# Patient Record
Sex: Female | Born: 1975 | Race: White | Hispanic: No | Marital: Married | Smoking: Former smoker
Health system: Southern US, Community
[De-identification: ages and names within clinical notes are randomized; demographics above are authoritative.]

## PROBLEM LIST (undated history)

## (undated) DIAGNOSIS — I10 Essential (primary) hypertension: Secondary | ICD-10-CM

## (undated) HISTORY — PX: TUBAL LIGATION: SHX77

---

## 2012-06-13 HISTORY — PX: TUBAL LIGATION: SHX77

## 2020-07-30 ENCOUNTER — Other Ambulatory Visit: Payer: Self-pay | Admitting: Surgery

## 2020-07-30 ENCOUNTER — Other Ambulatory Visit (HOSPITAL_COMMUNITY): Payer: Self-pay | Admitting: Surgery

## 2020-07-30 DIAGNOSIS — Z6841 Body Mass Index (BMI) 40.0 and over, adult: Secondary | ICD-10-CM

## 2020-08-18 ENCOUNTER — Ambulatory Visit (HOSPITAL_COMMUNITY)
Admission: RE | Admit: 2020-08-18 | Discharge: 2020-08-18 | Disposition: A | Discharge status: Home / Self Care | Payer: 59 | Source: Ambulatory Visit | Attending: Surgery | Admitting: Surgery

## 2020-08-18 ENCOUNTER — Encounter (HOSPITAL_COMMUNITY): Payer: Self-pay | Admitting: Radiology

## 2020-08-18 ENCOUNTER — Other Ambulatory Visit: Payer: Self-pay

## 2020-08-18 DIAGNOSIS — Z6841 Body Mass Index (BMI) 40.0 and over, adult: Secondary | ICD-10-CM | POA: Diagnosis present

## 2020-08-27 ENCOUNTER — Encounter: Payer: Self-pay | Admitting: Skilled Nursing Facility1

## 2020-08-27 ENCOUNTER — Encounter: Payer: 59 | Attending: Surgery | Admitting: Skilled Nursing Facility1

## 2020-08-27 ENCOUNTER — Other Ambulatory Visit: Payer: Self-pay

## 2020-08-27 DIAGNOSIS — E669 Obesity, unspecified: Secondary | ICD-10-CM | POA: Insufficient documentation

## 2020-08-27 NOTE — Progress Notes (Signed)
Nutrition Assessment for Bariatric Surgery Medical Nutrition Therapy Appt Start Time: 1:56 End Time: 2:48  Patient was seen on 08/27/2020 for Pre-Operative Nutrition Assessment. Letter of approval faxed to [facility] bariatric surgery program coordinator on 08/27/2020  Referral stated Supervised Weight Loss (SWL) visits needed: 12  Planned surgery: RYGB Pt expectation of surgery: to lose weight Pt expectation of dietitian: to help learn how to eat healthier    NUTRITION ASSESSMENT   Anthropometrics  Start weight at [facility]: 279.5 lbs (date: 08/27/2020)  Height: 66 in BMI: 45.11 kg/m2     Clinical  Medical hx: N/A Medications: see list Labs: A1C 5.6 Notable signs/symptoms: some back aches  Any previous deficiencies? No  Micronutrient Nutrition Focused Physical Exam: Hair: No issues observed Eyes: No issues observed Mouth: No issues observed Neck: No issues observed Nails: No issues observed Skin: No issues observed  Lifestyle & Dietary Hx   24-Hr Dietary Recall First Meal: protein shake Snack:  Second Meal 1pm: leftovers or fast food  Snack: fruit Third Meal: skipped Snack:  Beverages: coffee + flavored creamer, diet soda, water   Estimated Energy Needs Calories: 1500   NUTRITION DIAGNOSIS  Overweight/obesity ([place]-3.3) related to past poor dietary habits and physical inactivity as evidenced by patient w/ planned RYGB surgery following dietary guidelines for continued weight loss.    NUTRITION INTERVENTION  Nutrition counseling (C-1) and education (E-2) to facilitate bariatric surgery goals.   Pre-Op Goals Reviewed with the Patient . Track food and beverage intake (pen and paper, MyFitness Pal, Baritastic app, etc.) . Make healthy food choices while monitoring portion sizes . Consume 3 meals per day or try to eat every 3-5 hours . Avoid concentrated sugars and fried foods . Keep sugar & fat in the single digits per serving on food  labels . Practice CHEWING your food (aim for applesauce consistency) . Practice not drinking 15 minutes before, during, and 30 minutes after each meal and snack . Avoid all carbonated beverages (ex: soda, sparkling beverages)  . Limit caffeinated beverages (ex: coffee, tea, energy drinks) . Avoid all sugar-sweetened beverages (ex: regular soda, sports drinks)  . Avoid alcohol  . Aim for 64-100 ounces of FLUID daily (with at least half of fluid intake being plain water)  . Aim for at least 60-80 grams of PROTEIN daily . Look for a liquid protein source that contains ?15 g protein and ?5 g carbohydrate (ex: shakes, drinks, shots) . Make a list of non-food related activities . Physical activity is an important part of a healthy lifestyle so keep it moving! The goal is to reach 150 minutes of exercise per week, including cardiovascular and weight baring activity.  *Goals that are bolded indicate the pt would like to start working towards these  Handouts Provided Include  . Bariatric Surgery handouts (Nutrition Visits, Pre-Op Goals, Protein Shakes, Vitamins & Minerals)  Learning Style & Readiness for Change Teaching method utilized: Visual & Auditory  Demonstrated degree of understanding via: Teach Back  Readiness Level: contemplative  Barriers to learning/adherence to lifestyle change: none identified   RD's Notes for Next Visit . Assess pts adherence to chosen goals     MONITORING & EVALUATION Dietary intake, weekly physical activity, body weight, and pre-op goals reached at next nutrition visit.    Next Steps  Patient is to follow up at [facility] for Pre-Op Class >2 weeks before surgery for further nutrition education.  Pt to return for her next SWL visit

## 2020-09-09 ENCOUNTER — Encounter: Payer: 59 | Admitting: Skilled Nursing Facility1

## 2020-09-09 ENCOUNTER — Other Ambulatory Visit: Payer: Self-pay

## 2020-09-09 DIAGNOSIS — E669 Obesity, unspecified: Secondary | ICD-10-CM

## 2020-09-09 NOTE — Progress Notes (Signed)
Supervised Weight Loss Visit Bariatric Nutrition Education Appt Start Time: 3:15  End Time: 3:30  Planned Surgery: RYGB Pt Expectation of Surgery/ Goals: to be healthier and lose weight to be more active with children  1 out of 12 SWL Appointments   Star given previously: yes (09/09/2020)  NUTRITION ASSESSMENT  Anthropometrics  Start weight at [facility]: 279.5 lbs (date: 08/27/2020)  Current weight: 276.9 lbs (09/09/2020) Height: 66 in BMI: 44.69 kg/m2    Clinical  Medical hx: N/A Medications: see list Labs: A1C 5.6 Notable signs/symptoms: some back aches  Any previous deficiencies? No  Lifestyle & Dietary Hx  Pt states she has been logging her food and has been trying to stay under 1900 kcal/day. Pt states she has ordered protein shakes (Protein 2.0) and is looking forward to trying them. Pt states it's been difficult to cut out protein shakes because convenience. Pt states only eaten out 3x since last visit. Pt states she has been trying to thinking through what she eats and that logging has really helped her. Pt felt that some foods in particular like cheeseburgers have been shocking. Pt states she hasn't been weighing herself. Pt states she is feeling so much better and has much more energy. Pt has made a list of activities she enjoys: gardening, reading, puzzles, walks with dogs, diamond dots. Pt states she wants to work on portion sizes by switching to smaller plates/portions. Pt states she is motivated to lose weight for her children so she can do more physical things with them.  Estimated daily fluid intake: adequate Supplements:  Current average weekly physical activity: 20 minutes of yoga 5x/week.   24-Hr Dietary Recall First Meal: protein shake Snack: carrots, almonds, sunflower seeds Second Meal 1pm: leftovers  Snack: fruit Third Meal: grilled chicken + green beans + mac and cheese or rice Snack:  Beverages: coffee + flavored creamer, diet soda, water  Estimated  Energy Needs Calories: 1500  NUTRITION DIAGNOSIS  Overweight/obesity ([place]-3.3) related to past poor dietary habits and physical inactivity as evidenced by patient w/ planned RYGB surgery following dietary guidelines for continued weight loss.    NUTRITION INTERVENTION  Nutrition counseling (C-1) and education (E-2) to facilitate bariatric surgery goals.   Pre-Op Goals Reviewed with the Patient . Track food and beverage intake (pen and paper, MyFitness Pal, Baritastic app, etc.) . Make healthy food choices while monitoring portion sizes . Make a list of non-food related activities . Physical activity is an important part of a healthy lifestyle so keep it moving! The goal is to reach 150 minutes of exercise per week, including cardiovascular and weight baring activity. . NEW: Practice CHEWING your food (aim for applesauce consistency) . NEW: Aim for smaller portion sizes  *Goals that are bolded indicate the pt would like to start working towards these  Handouts Provided Include  . Bariatric Surgery handouts (Nutrition Visits, Pre-Op Goals, Protein Shakes, Vitamins & Minerals)  Learning Style & Readiness for Change Teaching method utilized: Visual & Auditory  Demonstrated degree of understanding via: Teach Back  Readiness Level: Action Barriers to learning/adherence to lifestyle change: none identified   RD's Notes for Next Visit . Assess pts adherence to chosen goals     MONITORING & EVALUATION Dietary intake, weekly physical activity, body weight, and pre-op goals reached at next nutrition visit.    Next Steps  Patient is to follow up at [facility] for Pre-Op Class >2 weeks before surgery for further nutrition education.  Pt to return for her next SWL  visit in 2 weeks.

## 2020-09-30 ENCOUNTER — Other Ambulatory Visit: Payer: Self-pay

## 2020-09-30 ENCOUNTER — Encounter: Payer: 59 | Attending: Surgery | Admitting: Skilled Nursing Facility1

## 2020-09-30 DIAGNOSIS — E669 Obesity, unspecified: Secondary | ICD-10-CM | POA: Insufficient documentation

## 2020-09-30 NOTE — Progress Notes (Signed)
Supervised Weight Loss Visit Bariatric Nutrition Education  Planned Surgery: RYGB Pt Expectation of Surgery/ Goals: to be healthier and lose weight to be more active with children  2 out of 12 SWL Appointments   Star given previously: yes (09/09/2020)  NUTRITION ASSESSMENT  Anthropometrics  Start weight at [facility]: 279.5 lbs (date: 08/27/2020)  Current weight: 275 Height: 66 in BMI: 44.45 kg/m2    Clinical  Medical hx: N/A Medications: see list Labs: A1C 5.6 Notable signs/symptoms: some back aches  Any previous deficiencies? No  Lifestyle & Dietary Hx  (09/09/2020) Pt states she has been logging her food and has been trying to stay under 1900 kcal/day. Pt states she has ordered protein shakes (Protein 2.0) and is looking forward to trying them. Pt states it's been difficult to cut out protein shakes because convenience. Pt states only eaten out 3x since last visit. Pt states she has been trying to thinking through what she eats and that logging has really helped her. Pt felt that some foods in particular like cheeseburgers have been shocking. Pt states she hasn't been weighing herself. Pt states she is feeling so much better and has much more energy. Pt has made a list of activities she enjoys: gardening, reading, puzzles, walks with dogs, diamond dots. Pt states she wants to work on portion sizes by switching to smaller plates/portions. Pt states she is motivated to lose weight for her children so she can do more physical things with them.  Pt states her 2 goals were to cut portions and chew her foods well stating that first day she was famished by 3pm so she added a timer to ring every 3 hours and eat. Pt state she is no longer eating fast food for snacks eating fruits and nuts. Pt state she has noticed due to her food choices she has had a lot more volume that has carried her throughout the day. Pt states she cut out all coffee.   Estimated daily fluid intake: adequate Supplements:   Current average weekly physical activity: 20 minutes of yoga 5x/week; some elliptical   24-Hr Dietary Recall: wakes 5:30am First Meal: fruit after yoga and then protein shake Snack: carrots, almonds, sunflower seeds Second Meal 1pm: sandwich + veggies or just green beans Snack: fruit and nuts Third Meal: grilled chicken + green beans + mac and cheese or rice or sandwich Snack:  Beverages: 3 diet soda per week, water + flavoring  Estimated Energy Needs Calories: 1500  NUTRITION DIAGNOSIS  Overweight/obesity ([place]-3.3) related to past poor dietary habits and physical inactivity as evidenced by patient w/ planned RYGB surgery following dietary guidelines for continued weight loss.    NUTRITION INTERVENTION  Nutrition counseling (C-1) and education (E-2) to facilitate bariatric surgery goals.   Pre-Op Goals Reviewed with the Patient . Track food and beverage intake (pen and paper, MyFitness Pal, Baritastic app, etc.) . Make healthy food choices while monitoring portion sizes . Make a list of non-food related activities . Physical activity is an important part of a healthy lifestyle so keep it moving! The goal is to reach 150 minutes of exercise per week, including cardiovascular and weight baring activity. . Continue: Practice CHEWING your food (aim for applesauce consistency) . continue: Aim for smaller portion sizes . NEW: increase water to 80-100 ounces; half with flavor half plain   *Goals that are bolded indicate the pt would like to start working towards these  Handouts Provided Include  . Bariatric Surgery handouts (Nutrition Visits, Pre-Op Goals,  Protein Shakes, Vitamins & Minerals)  Learning Style & Readiness for Change Teaching method utilized: Visual & Auditory  Demonstrated degree of understanding via: Teach Back  Readiness Level: Action Barriers to learning/adherence to lifestyle change: none identified   RD's Notes for Next Visit . Assess pts adherence to chosen  goals     MONITORING & EVALUATION Dietary intake, weekly physical activity, body weight, and pre-op goals reached at next nutrition visit.    Next Steps  Patient is to follow up at [facility] for Pre-Op Class >2 weeks before surgery for further nutrition education.  Pt to return for her next SWL visit in 1 week.

## 2020-10-08 ENCOUNTER — Other Ambulatory Visit: Payer: Self-pay

## 2020-10-08 ENCOUNTER — Encounter: Payer: 59 | Admitting: Skilled Nursing Facility1

## 2020-10-08 DIAGNOSIS — E669 Obesity, unspecified: Secondary | ICD-10-CM | POA: Diagnosis not present

## 2020-10-08 NOTE — Progress Notes (Signed)
Supervised Weight Loss Visit Bariatric Nutrition Education  Planned Surgery: RYGB Pt Expectation of Surgery/ Goals: to be healthier and lose weight to be more active with children  3 out of 12 SWL Appointments   Star given previously: yes (09/09/2020)  NUTRITION ASSESSMENT  Anthropometrics  Start weight at [facility]: 279.5 lbs (date: 08/27/2020)  Current weight: 272 Height: 66 in BMI: 44.00 kg/m2    Clinical  Medical hx: N/A Medications: see list Labs: A1C 5.6 Notable signs/symptoms: some back aches  Any previous deficiencies? No  Lifestyle & Dietary Hx  Pt states she logs her food into an app that tracks calories aiming for 1900 calories per day so if she gets to the end of the day and has calories over she will have some goodies.  Pt states she has been drinking 64 ounces per day working on getting in the fifth yetti stating a barrier is not having regular access to the restroom.  Pt states she got rid of her scales.   Estimated daily fluid intake: adequate Supplements:  Current average weekly physical activity: 20 minutes of yoga 5x/week; 2-3 times a week elliptical   24-Hr Dietary Recall: wakes 5:30am First Meal: fruit after yoga and then protein shake Snack : carrots, almonds, sunflower seeds Second Meal 1pm: sandwich + veggies or just green beans or manicotti + green beans Snack: fruit and nuts or roast beef sandwich + carrots + nuts Third Meal: grilled chicken + green beans + mac and cheese or rice or sandwich Snack: 3 Hershey kisses Beverages: 3 diet soda per week, water + flavoring  Estimated Energy Needs Calories: 1500  NUTRITION DIAGNOSIS  Overweight/obesity ([place]-3.3) related to past poor dietary habits and physical inactivity as evidenced by patient w/ planned RYGB surgery following dietary guidelines for continued weight loss.   NUTRITION INTERVENTION  Nutrition counseling (C-1) and education (E-2) to facilitate bariatric surgery goals. Discussion: how to  maintain the change without the motivator being dietitian visits or the upcoming surgery    Pre-Op Goals Reviewed with the Patient . Track food and beverage intake (pen and paper, MyFitness Pal, Baritastic app, etc.) . Make healthy food choices while monitoring portion sizes . Make a list of non-food related activities . Physical activity is an important part of a healthy lifestyle so keep it moving! The goal is to reach 150 minutes of exercise per week, including cardiovascular and weight baring activity. . Continue: Practice CHEWING your food (aim for applesauce consistency) . continue: Aim for smaller portion sizes . Continue: increase water to 80-100 ounces; half with flavor half plain   *Goals that are bolded indicate the pt would like to start working towards these  Handouts Provided Include  . Bariatric Surgery handouts (Nutrition Visits, Pre-Op Goals, Protein Shakes, Vitamins & Minerals)  Learning Style & Readiness for Change Teaching method utilized: Visual & Auditory  Demonstrated degree of understanding via: Teach Back  Readiness Level: Action Barriers to learning/adherence to lifestyle change: none identified   RD's Notes for Next Visit . Assess pts adherence to chosen goals     MONITORING & EVALUATION Dietary intake, weekly physical activity, body weight, and pre-op goals reached at next nutrition visit.    Next Steps  Patient is to follow up at [facility] for Pre-Op Class >2 weeks before surgery for further nutrition education.  Pt to return for her next SWL visit in 1-2 weeks

## 2020-11-05 ENCOUNTER — Encounter: Payer: 59 | Attending: Surgery | Admitting: Skilled Nursing Facility1

## 2020-11-05 ENCOUNTER — Other Ambulatory Visit: Payer: Self-pay

## 2020-11-05 DIAGNOSIS — E669 Obesity, unspecified: Secondary | ICD-10-CM | POA: Insufficient documentation

## 2020-11-05 NOTE — Progress Notes (Signed)
Supervised Weight Loss Visit Bariatric Nutrition Education  Planned Surgery: RYGB Pt Expectation of Surgery/ Goals: to be healthier and lose weight to be more active with children  4 out of 12 SWL Appointments   Star given previously: yes (09/09/2020)  NUTRITION ASSESSMENT  Anthropometrics  Start weight at [facility]: 279.5 lbs (date: 08/27/2020)  Current weight: 271.9 Height: 66 in BMI: 43.89 kg/m2    Clinical  Medical hx: N/A Medications: see list Labs: A1C 5.6 Notable signs/symptoms: some back aches  Any previous deficiencies? No  Lifestyle & Dietary Hx  Pt states she got rid of her scales.   Pt states she noticed her daughter has gotten heal;thy eating habits as well. Pt states she did fall out of doing her yoga but then got back into it due to not feeling well and noticing she feels better when she does yoga.  Pt has been making connections between her lifestyle and her health/how she feels allowing for intrinsic motivation.   Estimated daily fluid intake: adequate Supplements:  Current average weekly physical activity: 20 minutes of yoga 5x/week; 2-3 times a week elliptical   24-Hr Dietary Recall: wakes 5:30am First Meal 7:30-8: fruit after yoga and then protein shake Snack : carrots, almonds, sunflower seeds or cabbage Second Meal 1pm: sandwich + veggies or just green beans or manicotti + green beans or 1 hot pocket + carrots + black cherries Snack: nuts  Third Meal: taco salad: chips, cheese, tomatoes, lettuce, black olives, sour cream Snack: 3 Hershey kisses Beverages: 3 diet soda per week, water + flavoring  Estimated Energy Needs Calories: 1500  NUTRITION DIAGNOSIS  Overweight/obesity ([place]-3.3) related to past poor dietary habits and physical inactivity as evidenced by patient w/ planned RYGB surgery following dietary guidelines for continued weight loss.   NUTRITION INTERVENTION  Nutrition counseling (C-1) and education (E-2) to facilitate bariatric surgery  goals. Discussion: how to maintain the change without the motivator being dietitian visits or the upcoming surgery    Pre-Op Goals Reviewed with the Patient . Track food and beverage intake (pen and paper, MyFitness Pal, Baritastic app, etc.) . Make healthy food choices while monitoring portion sizes . Make a list of non-food related activities . Physical activity is an important part of a healthy lifestyle so keep it moving! The goal is to reach 150 minutes of exercise per week, including cardiovascular and weight baring activity. . Continue: Practice CHEWING your food (aim for applesauce consistency) . continue: Aim for smaller portion sizes . Continue: increase water to 80-100 ounces; half with flavor half plain  . NEW: increase activity intensity; elliptical 3 days a week  Handouts Previously Provided Include  . Bariatric Surgery handouts (Nutrition Visits, Pre-Op Goals, Protein Shakes, Vitamins & Minerals)  Learning Style & Readiness for Change Teaching method utilized: Visual & Auditory  Demonstrated degree of understanding via: Teach Back  Readiness Level: Action Barriers to learning/adherence to lifestyle change: none identified   RD's Notes for Next Visit . Assess pts adherence to chosen goals     MONITORING & EVALUATION Dietary intake, weekly physical activity, body weight, and pre-op goals reached at next nutrition visit.    Next Steps  Patient is to follow up at [facility] for Pre-Op Class >2 weeks before surgery for further nutrition education.  Pt to return for her next SWL visit in 1-2 weeks

## 2020-12-07 ENCOUNTER — Other Ambulatory Visit: Payer: Self-pay

## 2020-12-07 ENCOUNTER — Encounter: Payer: 59 | Attending: Surgery | Admitting: Skilled Nursing Facility1

## 2020-12-07 DIAGNOSIS — E669 Obesity, unspecified: Secondary | ICD-10-CM | POA: Insufficient documentation

## 2020-12-07 NOTE — Progress Notes (Signed)
Supervised Weight Loss Visit Bariatric Nutrition Education  Planned Surgery: RYGB Pt Expectation of Surgery/ Goals: to be healthier and lose weight to be more active with children  5 out of 12 SWL Appointments   Star given previously: yes (09/09/2020)  NUTRITION ASSESSMENT  Anthropometrics  Start weight at [facility]: 279.5 lbs (date: 08/27/2020)  Current weight: 267 Height: 66 in BMI: 43.09 kg/m2    Clinical  Medical hx: N/A Medications: see list Labs: A1C 5.6 Notable signs/symptoms: some back aches  Any previous deficiencies? No  Lifestyle & Dietary Hx  Previous: Pt states she got rid of her scales.   Previous: Pt states she noticed her daughter has gotten heal;thy eating habits as well. Pt states she did fall out of doing her yoga but then got back into it due to not feeling well and noticing she feels better when she does yoga.  Previous: Pt has been making connections between her lifestyle and her health/how she feels allowing for intrinsic motivation.   Pt state she sleeps well about 6-7 hours per night with rest. Pt states she has bowel movements daily without issue. Pt states meal prepping on sundays has been veery helpful throughout the week. Pt states she has been working 60 hours a week due to her partner at work being out. Pt states she was not able ti increase her elliptical but increase her walking.   Estimated daily fluid intake: adequate Supplements:  Current average weekly physical activity: 20 minutes of yoga 5x/week; 2-3 times a week elliptical   24-Hr Dietary Recall: wakes 5:30am First Meal 7:30-8: banana Snack : 1/2 cup oatmeal Second Meal 1pm: baked potato + salad + 4 ounce steak + italian dressing Snack: nuts  Third Meal: cheese ravioli + salad Snack: 3 Hershey kisses Beverages: 3 diet soda per week, water + flavoring, plain water  Estimated Energy Needs Calories: 1500  NUTRITION DIAGNOSIS  Overweight/obesity ([place]-3.3) related to past poor dietary  habits and physical inactivity as evidenced by patient w/ planned RYGB surgery following dietary guidelines for continued weight loss.   NUTRITION INTERVENTION  Nutrition counseling (C-1) and education (E-2) to facilitate bariatric surgery goals. Discussion: how to maintain the change without the motivator being dietitian visits or the upcoming surgery    Pre-Op Goals Reviewed with the Patient Track food and beverage intake (pen and paper, MyFitness Pal, Baritastic app, etc.) Make healthy food choices while monitoring portion sizes Make a list of non-food related activities Physical activity is an important part of a healthy lifestyle so keep it moving! The goal is to reach 150 minutes of exercise per week, including cardiovascular and weight baring activity. Continue: Practice CHEWING your food (aim for applesauce consistency) continue: Aim for smaller portion sizes Continue: increase water to 80-100 ounces; half with flavor half plain  Continue/NEW: increase activity intensity; elliptical 3 days a week  Handouts Previously Provided Include  Bariatric Surgery handouts (Nutrition Visits, Pre-Op Goals, Protein Shakes, Vitamins & Minerals)  Learning Style & Readiness for Change Teaching method utilized: Visual & Auditory  Demonstrated degree of understanding via: Teach Back  Readiness Level: Action Barriers to learning/adherence to lifestyle change: none identified   RD's Notes for Next Visit Assess pts adherence to chosen goals     MONITORING & EVALUATION Dietary intake, weekly physical activity, body weight, and pre-op goals reached at next nutrition visit.    Next Steps  Patient is to follow up at [facility] for Pre-Op Class >2 weeks before surgery for further nutrition education.  Pt to return for her next SWL visit in 1-2 weeks

## 2020-12-17 ENCOUNTER — Other Ambulatory Visit: Payer: Self-pay

## 2020-12-17 ENCOUNTER — Encounter: Payer: 59 | Attending: Surgery | Admitting: Skilled Nursing Facility1

## 2020-12-17 DIAGNOSIS — E669 Obesity, unspecified: Secondary | ICD-10-CM | POA: Insufficient documentation

## 2020-12-17 NOTE — Progress Notes (Signed)
Supervised Weight Loss Visit Bariatric Nutrition Education  Planned Surgery: RYGB Pt Expectation of Surgery/ Goals: to be healthier and lose weight to be more active with children  6 out of 12 SWL Appointments   Star given previously: yes (09/09/2020)  NUTRITION ASSESSMENT  Anthropometrics  Start weight at [facility]: 279.5 lbs (date: 08/27/2020)  Current weight: 266 Height: 66 in BMI: 42.93 kg/m2    Clinical  Medical hx: N/A Medications: see list Labs: A1C 5.6 Notable signs/symptoms: some back aches  Any previous deficiencies? No  Lifestyle & Dietary Hx  Previous: Pt states she got rid of her scales.   Pt states she hates the elliptical so has been going for 2 mile walks when she does not have to go in super early and does the elliptical. Pt states she has had an increased appetite: Dietitian advised not to act on it due to the increase in physical activity.   Estimated daily fluid intake: adequate Supplements:  Current average weekly physical activity: 20 minutes of yoga 5x/week; 2-3 times a week elliptical, walks a couple times a week  24-Hr Dietary Recall: wakes 5:30am First Meal 7:30-8: banana + protein shake Snack : fruit Second Meal 1pm: sandwich + carrots + rice crispy treat Snack: pack of crackers Third Meal: sandwich + cup of broccoli + mac n cheese Snack: 3 Hershey kisses Beverages: 3 diet soda per week, water + flavoring, plain water  Estimated Energy Needs Calories: 1500  NUTRITION DIAGNOSIS  Overweight/obesity ([place]-3.3) related to past poor dietary habits and physical inactivity as evidenced by patient w/ planned RYGB surgery following dietary guidelines for continued weight loss.   NUTRITION INTERVENTION  Nutrition counseling (C-1) and education (E-2) to facilitate bariatric surgery goals. Discussion: how to maintain the change without the motivator being dietitian visits or the upcoming surgery    Pre-Op Goals Reviewed with the Patient Track food and  beverage intake (pen and paper, MyFitness Pal, Baritastic app, etc.) Make healthy food choices while monitoring portion sizes Make a list of non-food related activities Physical activity is an important part of a healthy lifestyle so keep it moving! The goal is to reach 150 minutes of exercise per week, including cardiovascular and weight baring activity. Continue: Practice CHEWING your food (aim for applesauce consistency) continue: Aim for smaller portion sizes Continue: increase water to 80-100 ounces; half with flavor half plain  Continue: increase activity intensity; elliptical 3 days a week NEW: increase intensity of workouts and increase distance to 3 miles for walks  Handouts Previously Provided Include  Bariatric Surgery handouts (Nutrition Visits, Pre-Op Goals, Protein Shakes, Vitamins & Minerals)  Learning Style & Readiness for Change Teaching method utilized: Visual & Auditory  Demonstrated degree of understanding via: Teach Back  Readiness Level: Action Barriers to learning/adherence to lifestyle change: none identified   RD's Notes for Next Visit Assess pts adherence to chosen goals     MONITORING & EVALUATION Dietary intake, weekly physical activity, body weight, and pre-op goals reached at next nutrition visit.    Next Steps  Patient is to follow up at [place] for Pre-Op Class >2 weeks before surgery for further nutrition education.  Pt to return for her next SWL visit in 1-2 weeks

## 2020-12-31 ENCOUNTER — Other Ambulatory Visit: Payer: Self-pay

## 2020-12-31 ENCOUNTER — Encounter: Payer: 59 | Admitting: Skilled Nursing Facility1

## 2020-12-31 DIAGNOSIS — E669 Obesity, unspecified: Secondary | ICD-10-CM

## 2020-12-31 NOTE — Progress Notes (Signed)
Supervised Weight Loss Visit Bariatric Nutrition Education  Planned Surgery: RYGB Pt Expectation of Surgery/ Goals: to be healthier and lose weight to be more active with children  7 out of 12 SWL Appointments   Star given previously: yes (09/09/2020)  NUTRITION ASSESSMENT  Anthropometrics  Start weight at [facility]: 279.5 lbs (date: 08/27/2020)  Current weight: 262 pounds Height: 66 in BMI: 42.42 kg/m2    Clinical  Medical hx: N/A Medications: see list Labs: A1C 5.6 Notable signs/symptoms: some back aches  Any previous deficiencies? No  Lifestyle & Dietary Hx  Previous: Pt states she got rid of her scales.   Pt states she has been doing really with 3 miles in the morning.  Pt states she noticed flavoring in her water caused break outs for her so increasing her water she has had less breakouts.  Pt states she can own her success up to trying eating more often throughout the day and never felt hunger so it felt doable.   Estimated daily fluid intake: adequate Supplements:  Current average weekly physical activity: 20 minutes of yoga 5x/week; 2-3 times a week elliptical, walks a couple times a week  24-Hr Dietary Recall: wakes 5:30am First Meal 7:30-8: banana + protein shake Snack : fruit Second Meal 1pm: sandwich + carrots  Snack: cherries Snack: pretzels + cheese Third Meal: sandwich + cup of broccoli + mac n cheese or steak + baked potato + beans Snack:  Beverages: 3 diet soda per week, water + flavoring,4 17 ounces  plain water  Estimated Energy Needs Calories: 1500  NUTRITION DIAGNOSIS  Overweight/obesity ([place]-3.3) related to past poor dietary habits and physical inactivity as evidenced by patient w/ planned RYGB surgery following dietary guidelines for continued weight loss.   NUTRITION INTERVENTION  Nutrition counseling (C-1) and education (E-2) to facilitate bariatric surgery goals. Discussion: how to maintain the change without the motivator being dietitian  visits or the upcoming surgery    Pre-Op Goals Reviewed with the Patient Track food and beverage intake (pen and paper, MyFitness Pal, Baritastic app, etc.) Make healthy food choices while monitoring portion sizes Make a list of non-food related activities Physical activity is an important part of a healthy lifestyle so keep it moving! The goal is to reach 150 minutes of exercise per week, including cardiovascular and weight baring activity. Continue: Practice CHEWING your food (aim for applesauce consistency) continue: Aim for smaller portion sizes Continue: increase water to 80-100 ounces; half with flavor half plain  Continue: increase activity intensity; elliptical 3 days a week continue: increase intensity of workouts and increase distance to 3 miles for walks NEW: tracking walking verses jogging tracking what her pace is   Handouts Previously Provided Include  Bariatric Surgery handouts (Nutrition Visits, Pre-Op Goals, Protein Shakes, Vitamins & Minerals)  Learning Style & Readiness for Change Teaching method utilized: Visual & Auditory  Demonstrated degree of understanding via: Teach Back  Readiness Level: Action Barriers to learning/adherence to lifestyle change: none identified   RD's Notes for Next Visit Assess pts adherence to chosen goals     MONITORING & EVALUATION Dietary intake, weekly physical activity, body weight, and pre-op goals reached at next nutrition visit.    Next Steps  Patient is to follow up at [place] for Pre-Op Class >2 weeks before surgery for further nutrition education.  Pt to return for her next SWL visit in 1-2 weeks

## 2021-01-13 ENCOUNTER — Encounter: Payer: 59 | Attending: Surgery | Admitting: Skilled Nursing Facility1

## 2021-01-13 ENCOUNTER — Other Ambulatory Visit: Payer: Self-pay

## 2021-01-13 DIAGNOSIS — E669 Obesity, unspecified: Secondary | ICD-10-CM | POA: Diagnosis not present

## 2021-01-13 NOTE — Progress Notes (Signed)
Supervised Weight Loss Visit Bariatric Nutrition Education  Planned Surgery: RYGB Pt Expectation of Surgery/ Goals: to be healthier and lose weight to be more active with children  8 out of 12 SWL Appointments   Star given previously: yes (09/09/2020)  NUTRITION ASSESSMENT  Anthropometrics  Start weight at [facility]: 279.5 lbs (date: 08/27/2020)  Current weight: 263.5 pounds Height: 66 in BMI: 42.53 kg/m2    Clinical  Medical hx: N/A Medications: see list Labs: A1C 5.6 Notable signs/symptoms: some back aches  Any previous deficiencies? No  Lifestyle & Dietary Hx  Previous: Pt states she got rid of her scales.   Pt states she was not able to increase her activity intensity due to her children walking with her recently but is excited to work on her goal of cutting down her time per mile.  Estimated daily fluid intake: adequate Supplements:  Current average weekly physical activity: 20 minutes of yoga 5x/week; 2-3 times a week elliptical, walks a couple times a week  24-Hr Dietary Recall: wakes 5:30am and does yoga First Meal 7:30-8: dry toast + peach preserves  Snack : banana Second Meal 1pm: sandwich + carrots + cherries Snack: greek yogurt Third Meal: sandwich + cup of broccoli + mac n cheese or steak + baked potato + beans or [place] grilled chicken + pineapple +  rice + veggies Snack:  Beverages: plain water  Estimated Energy Needs Calories: 1500  NUTRITION DIAGNOSIS  Overweight/obesity ([place]-3.3) related to past poor dietary habits and physical inactivity as evidenced by patient w/ planned RYGB surgery following dietary guidelines for continued weight loss.   NUTRITION INTERVENTION  Nutrition counseling (C-1) and education (E-2) to facilitate bariatric surgery goals. Discussion: how to maintain the change without the motivator being dietitian visits or the upcoming surgery    Pre-Op Goals Reviewed with the Patient Track food and beverage intake (pen and paper,  MyFitness Pal, Baritastic app, etc.) Make healthy food choices while monitoring portion sizes Make a list of non-food related activities Physical activity is an important part of a healthy lifestyle so keep it moving! The goal is to reach 150 minutes of exercise per week, including cardiovascular and weight baring activity. Continue: Practice CHEWING your food (aim for applesauce consistency) continue: Aim for smaller portion sizes Continue: increase water to 80-100 ounces; half with flavor half plain  Continue: increase activity intensity; elliptical 3 days a week continue: increase intensity of workouts and increase distance to 3 miles for walks Continue/NEW: tracking walking verses jogging tracking what her pace is  NEW: work on staying in the moment with your meals and eating until satisfaction rather than fullness  Handouts Previously Provided Include  Bariatric Surgery handouts (Nutrition Visits, Pre-Op Goals, Protein Shakes, Vitamins & Minerals)  Learning Style & Readiness for Change Teaching method utilized: Visual & Auditory  Demonstrated degree of understanding via: Teach Back  Readiness Level: Action Barriers to learning/adherence to lifestyle change: none identified   RD's Notes for Next Visit Assess pts adherence to chosen goals     MONITORING & EVALUATION Dietary intake, weekly physical activity, body weight, and pre-op goals reached at next nutrition visit.    Next Steps  Patient is to follow up at [place] for Pre-Op Class >2 weeks before surgery for further nutrition education.  Pt to return for her next SWL visit in 1-2 weeks

## 2021-02-01 ENCOUNTER — Encounter: Payer: 59 | Admitting: Skilled Nursing Facility1

## 2021-02-02 ENCOUNTER — Encounter: Payer: 59 | Admitting: Skilled Nursing Facility1

## 2021-02-02 ENCOUNTER — Other Ambulatory Visit: Payer: Self-pay

## 2021-02-02 DIAGNOSIS — E669 Obesity, unspecified: Secondary | ICD-10-CM

## 2021-02-02 NOTE — Progress Notes (Signed)
Supervised Weight Loss Visit Bariatric Nutrition Education  Planned Surgery: RYGB Pt Expectation of Surgery/ Goals: to be healthier and lose weight to be more active with children  9 out of 12 SWL Appointments    NUTRITION ASSESSMENT  Anthropometrics  Start weight at [facility]: 279.5 lbs (date: 08/27/2020)  Current weight: 263.5 pounds Height: 66 in BMI: 42.53 kg/m2    Clinical  Medical hx: N/A Medications: see list Labs: A1C 5.6 Notable signs/symptoms: some back aches  Any previous deficiencies? No  Lifestyle & Dietary Hx  Previous: Pt states she got rid of her scales.   Pt states she just got back from the beach so was worried she would not do well on vacation but states she made informed decisions. Pt states she was still able to go for walks in the morning.  Pt states she was able to increase her pace for her walks.   Estimated daily fluid intake: adequate Supplements:  Current average weekly physical activity: 20 minutes of yoga 5x/week; 2-3 times a week elliptical, walks a couple times a week  24-Hr Dietary Recall: wakes 5:30am and does yoga First Meal 7:30-8: dry toast + peach preserves of 2 packets of oatmeal Snack : banana Second Meal 1pm: sandwich + carrots + cherries or salad  Snack: greek yogurt Third Meal: sandwich + cup of broccoli + mac n cheese or steak + baked potato + beans or [place] grilled chicken + pineapple +  rice + veggies Snack:  Beverages: plain water  Estimated Energy Needs Calories: 1500  NUTRITION DIAGNOSIS  Overweight/obesity ([place]-3.3) related to past poor dietary habits and physical inactivity as evidenced by patient w/ planned RYGB surgery following dietary guidelines for continued weight loss.   NUTRITION INTERVENTION  Nutrition counseling (C-1) and education (E-2) to facilitate bariatric surgery goals. Discussion: how to maintain the change without the motivator being dietitian visits or the upcoming surgery    Pre-Op Goals Reviewed  with the Patient Track food and beverage intake (pen and paper, MyFitness Pal, Baritastic app, etc.) Make healthy food choices while monitoring portion sizes Make a list of non-food related activities Physical activity is an important part of a healthy lifestyle so keep it moving! The goal is to reach 150 minutes of exercise per week, including cardiovascular and weight baring activity. Continue: Practice CHEWING your food (aim for applesauce consistency) continue: Aim for smaller portion sizes Continue: increase water to 80-100 ounces; half with flavor half plain  Continue: increase activity intensity; elliptical 3 days a week continue: increase intensity of workouts and increase distance to 3 miles for walks Continue: tracking walking verses jogging tracking what her pace is  Continue: work on staying in the moment with your meals and eating until satisfaction rather than fullness NEW: avoid drinking with meals   Handouts Previously Provided Include  Bariatric Surgery handouts (Nutrition Visits, Pre-Op Goals, Protein Shakes, Vitamins & Minerals)  Learning Style & Readiness for Change Teaching method utilized: Visual & Auditory  Demonstrated degree of understanding via: Teach Back  Readiness Level: Action Barriers to learning/adherence to lifestyle change: none identified   RD's Notes for Next Visit Assess pts adherence to chosen goals     MONITORING & EVALUATION Dietary intake, weekly physical activity, body weight, and pre-op goals reached at next nutrition visit.    Next Steps  Patient is to follow up at [place] for Pre-Op Class >2 weeks before surgery for further nutrition education.  Pt to return for her next SWL visit in 1-2 weeks

## 2021-03-11 ENCOUNTER — Ambulatory Visit: Payer: Self-pay | Admitting: Surgery

## 2021-03-11 ENCOUNTER — Encounter: Payer: 59 | Attending: Surgery | Admitting: Skilled Nursing Facility1

## 2021-03-11 ENCOUNTER — Other Ambulatory Visit: Payer: Self-pay

## 2021-03-11 DIAGNOSIS — E669 Obesity, unspecified: Secondary | ICD-10-CM

## 2021-03-11 NOTE — Progress Notes (Signed)
Supervised Weight Loss Visit Bariatric Nutrition Education  Planned Surgery: RYGB Pt Expectation of Surgery/ Goals: to be healthier and lose weight to be more active with children  10 out of 12 SWL Appointments    NUTRITION ASSESSMENT  Anthropometrics  Start weight at [facility]: 279.5 lbs (date: 08/27/2020)  Current weight: 264.5 pounds Height: 66 in BMI: 42.69 kg/m2    Clinical  Medical hx: N/A Medications: see list Labs: A1C 5.6, HDL 37.4, vitamin D 28 Notable signs/symptoms: some back aches  Any previous deficiencies? No  Lifestyle & Dietary Hx  Previous: Pt states she got rid of her scales.    Pt sates she recently had covid and feels overall well now but quicker to get winded.   Estimated daily fluid intake: adequate Supplements:  Current average weekly physical activity: 20 minutes of yoga 5x/week; 2-3 times a week elliptical, walks a couple times a week  24-Hr Dietary Recall: wakes 5:30am and does yoga First Meal 7:30-8: dry toast + peach preserves of 1 packets of oatmeal Snack : banana Second Meal 1pm: sandwich + carrots + cherries or salad or roast beef sandwich + green beans Snack: greek yogurt or chicken salad + carrots Third Meal: sandwich + cup of broccoli + mac n cheese or steak + baked potato + beans or [place] grilled chicken + pineapple +  rice + veggies or steak + egg + broccoli Snack:  Beverages: plain water  Estimated Energy Needs Calories: 1500  NUTRITION DIAGNOSIS  Overweight/obesity (-3.3) related to past poor dietary habits and physical inactivity as evidenced by patient w/ planned RYGB surgery following dietary guidelines for continued weight loss.   NUTRITION INTERVENTION  Nutrition counseling (C-1) and education (E-2) to facilitate bariatric surgery goals. Discussion: how to maintain the change without the motivator being dietitian visits or the upcoming surgery    Pre-Op Goals Reviewed with the Patient Track food and beverage intake (pen  and paper, MyFitness Pal, Baritastic app, etc.) Make healthy food choices while monitoring portion sizes Make a list of non-food related activities Physical activity is an important part of a healthy lifestyle so keep it moving! The goal is to reach 150 minutes of exercise per week, including cardiovascular and weight baring activity. Continue: Practice CHEWING your food (aim for applesauce consistency) continue: Aim for smaller portion sizes Continue: increase water to 80-100 ounces; half with flavor half plain  Continue: increase activity intensity; elliptical 3 days a week continue: increase intensity of workouts and increase distance to 3 miles for walks Continue: tracking walking verses jogging tracking what her pace is  Continue: work on staying in the moment with your meals and eating until satisfaction rather than fullness continue: avoid drinking with meals  NEW: build your activity back up  Handouts Previously Provided Include  Bariatric Surgery handouts (Nutrition Visits, Pre-Op Goals, Protein Shakes, Vitamins & Minerals)  Learning Style & Readiness for Change Teaching method utilized: Visual & Auditory  Demonstrated degree of understanding via: Teach Back  Readiness Level: Action Barriers to learning/adherence to lifestyle change: none identified   RD's Notes for Next Visit Assess pts adherence to chosen goals     MONITORING & EVALUATION Dietary intake, weekly physical activity, body weight, and pre-op goals reached at next nutrition visit.    Next Steps  Patient is to follow up at [place] for Pre-Op Class >2 weeks before surgery for further nutrition education.  Pt to return for her next SWL visit in 1-2 weeks

## 2021-03-30 ENCOUNTER — Ambulatory Visit: Payer: 59 | Admitting: Skilled Nursing Facility1

## 2021-04-05 ENCOUNTER — Other Ambulatory Visit: Payer: Self-pay

## 2021-04-05 ENCOUNTER — Encounter: Payer: 59 | Attending: Surgery | Admitting: Skilled Nursing Facility1

## 2021-04-05 DIAGNOSIS — E669 Obesity, unspecified: Secondary | ICD-10-CM | POA: Diagnosis not present

## 2021-04-05 NOTE — Progress Notes (Signed)
Supervised Weight Loss Visit Bariatric Nutrition Education  Planned Surgery: RYGB Pt Expectation of Surgery/ Goals: to be healthier and lose weight to be more active with children  11 out of 12 SWL Appointments    NUTRITION ASSESSMENT  Anthropometrics  Start weight at [facility]: 279.5 lbs (date: 08/27/2020)  Current weight: 266 pounds Height: 66 in BMI: 42.93 kg/m2    Clinical  Medical hx: N/A Medications: see list Labs: A1C 5.6, HDL 37.4, vitamin D 28 Notable signs/symptoms: some back aches  Any previous deficiencies? No  Lifestyle & Dietary Hx  Pt states she has gotten back up to her 3 mile activity.  Pt states she recognizes she overate a touch which resulted in the weight gain.  Pt states she got a rebounder.   Estimated daily fluid intake: adequate Supplements:  Current average weekly physical activity: 20 minutes of yoga 5x/week; 2-3 times a week elliptical, walks a couple times a week  24-Hr Dietary Recall: wakes 5:30am and does yoga First Meal 7:30-8: dry toast + peach preserves of 1 packets of oatmeal Snack : banana Second Meal 1pm: sandwich + carrots + cherries or salad or roast beef sandwich + green beans or chili Snack: greek yogurt or chicken salad + carrots Third Meal: sandwich + cup of broccoli + mac n cheese or steak + baked potato + beans or [place] grilled chicken + pineapple +  rice + veggies or steak + egg + broccoli Snack:  Beverages: plain water  Estimated Energy Needs Calories: 1500  NUTRITION DIAGNOSIS  Overweight/obesity ([place]-3.3) related to past poor dietary habits and physical inactivity as evidenced by patient w/ planned RYGB surgery following dietary guidelines for continued weight loss.   NUTRITION INTERVENTION  Nutrition counseling (C-1) and education (E-2) to facilitate bariatric surgery goals. Discussion: how to maintain the change without the motivator being dietitian visits or the upcoming surgery    Pre-Op Goals Reviewed with the  Patient Track food and beverage intake (pen and paper, MyFitness Pal, Baritastic app, etc.) Make healthy food choices while monitoring portion sizes Make a list of non-food related activities Physical activity is an important part of a healthy lifestyle so keep it moving! The goal is to reach 150 minutes of exercise per week, including cardiovascular and weight baring activity. Continue: Practice CHEWING your food (aim for applesauce consistency) continue: Aim for smaller portion sizes Continue: increase water to 80-100 ounces; half with flavor half plain  Continue: increase activity intensity; elliptical 3 days a week continue: increase intensity of workouts and increase distance to 3 miles for walks Continue: tracking walking verses jogging tracking what her pace is  Continue: work on staying in the moment with your meals and eating until satisfaction rather than fullness continue: avoid drinking with meals  continue: build your activity back up New: portion control  Handouts Previously Provided Include  Bariatric Surgery handouts (Nutrition Visits, Pre-Op Goals, Protein Shakes, Vitamins & Minerals)  Learning Style & Readiness for Change Teaching method utilized: Visual & Auditory  Demonstrated degree of understanding via: Teach Back  Readiness Level: Action Barriers to learning/adherence to lifestyle change: none identified   RD's Notes for Next Visit Assess pts adherence to chosen goals     MONITORING & EVALUATION Dietary intake, weekly physical activity, body weight, and pre-op goals reached at next nutrition visit.    Next Steps  Patient is to follow up at [place] for Pre-Op Class >2 weeks before surgery for further nutrition education.  Pt to return for her next SWL  visit in 1-2 weeks

## 2021-04-14 ENCOUNTER — Encounter: Payer: 59 | Attending: Surgery | Admitting: Skilled Nursing Facility1

## 2021-04-14 ENCOUNTER — Other Ambulatory Visit: Payer: Self-pay

## 2021-04-14 DIAGNOSIS — E669 Obesity, unspecified: Secondary | ICD-10-CM | POA: Diagnosis present

## 2021-04-14 NOTE — Progress Notes (Signed)
Supervised Weight Loss Visit Bariatric Nutrition Education  Planned Surgery: RYGB Pt Expectation of Surgery/ Goals: to be healthier and lose weight to be more active with children  12 out of 12 SWL Appointments   Pt completed visits.   Pt has cleared nutrition requirements.       NUTRITION ASSESSMENT  Anthropometrics  Start weight at [facility]: 279.5 lbs (date: 08/27/2020)  Current weight: 265 pounds Height: 66 in BMI: 42.77 kg/m2    Clinical  Medical hx: N/A Medications: see list Labs: A1C 5.6, HDL 37.4, vitamin D 28 Notable signs/symptoms: some back aches  Any previous deficiencies? No  Lifestyle & Dietary Hx  Pt states in the last 12 visits she has learned how important portion control is, eating often enough, what the portions actually are, increased water amount but not while eating, reduction of diet soda, focusing on the eating rather than the weight specifically.   Pt states he will have to be intentional with portion sizes after surgery.   Estimated daily fluid intake: adequate Supplements:  Current average weekly physical activity: 20 minutes of yoga 5x/week; 2-3 times a week elliptical, walks a couple times a week  24-Hr Dietary Recall: wakes 5:30am and does yoga: continued  First Meal 7:30-8: dry toast + peach preserves of 1 packets of oatmeal Snack : banana Second Meal 1pm: sandwich + carrots + cherries or salad or roast beef sandwich + green beans or chili Snack: greek yogurt or chicken salad + carrots Third Meal: sandwich + cup of broccoli + mac n cheese or steak + baked potato + beans or [place] grilled chicken + pineapple +  rice + veggies or steak + egg + broccoli Snack:  Beverages: plain water  Estimated Energy Needs Calories: 1500  NUTRITION DIAGNOSIS  Overweight/obesity ([place]-3.3) related to past poor dietary habits and physical inactivity as evidenced by patient w/ planned RYGB surgery following dietary guidelines for continued weight loss.    NUTRITION INTERVENTION  Nutrition counseling (C-1) and education (E-2) to facilitate bariatric surgery goals. Discussion: how to maintain the change without the motivator being dietitian visits or the upcoming surgery    Pre-Op Goals Reviewed with the Patient Track food and beverage intake (pen and paper, MyFitness Pal, Baritastic app, etc.) Make healthy food choices while monitoring portion sizes Make a list of non-food related activities Physical activity is an important part of a healthy lifestyle so keep it moving! The goal is to reach 150 minutes of exercise per week, including cardiovascular and weight baring activity. Continue: Practice CHEWING your food (aim for applesauce consistency) continue: Aim for smaller portion sizes Continue: increase water to 80-100 ounces; half with flavor half plain  Continue: increase activity intensity; elliptical 3 days a week continue: increase intensity of workouts and increase distance to 3 miles for walks Continue: tracking walking verses jogging tracking what her pace is  Continue: work on staying in the moment with your meals and eating until satisfaction rather than fullness continue: avoid drinking with meals  continue: build your activity back up continue: portion control  Handouts Previously Provided Include  Bariatric Surgery handouts (Nutrition Visits, Pre-Op Goals, Protein Shakes, Vitamins & Minerals)  Learning Style & Readiness for Change Teaching method utilized: Visual & Auditory  Demonstrated degree of understanding via: Teach Back  Readiness Level: Action Barriers to learning/adherence to lifestyle change: none identified      MONITORING & EVALUATION Dietary intake, weekly physical activity, body weight, and pre-op goals   Next Steps  Patient is to  follow up at [facility] for Pre-Op Class >2 weeks before surgery for further nutrition education.  Pt has completed visits. No further supervised visits required/recomended

## 2021-05-03 ENCOUNTER — Encounter (HOSPITAL_COMMUNITY): Payer: Self-pay | Admitting: Surgery

## 2021-05-17 NOTE — H&P (Signed)
Admitting Physician: Hyman Hopes Tobby Fawcett  Service: Bariatric surgery  CC: Morbid obesity  Subjective   HPI: Charlene Martinez is an 45 y.o. female who is here for EGD prior to bariatric surgery  History reviewed. No pertinent past medical history.  Past Surgical History:  Procedure Laterality Date   CESAREAN SECTION     TUBAL LIGATION      History reviewed. No pertinent family history.  Social:  has no history on file for tobacco use, alcohol use, and drug use.  Allergies:  Allergies  Allergen Reactions   Sulfa Antibiotics Anaphylaxis    Medications: Current Outpatient Medications  Medication Instructions   DENTA 5000 PLUS 1.1 % CREA dental cream 1 application, Oral, Daily at bedtime   levonorgestrel (MIRENA) 20 MCG/DAY IUD 1 Intra Uterine Device, Intrauterine, See admin instructions, Every 5 years   Vitamin D (Ergocalciferol) (DRISDOL) 50,000 Units, Oral, Weekly    ROS - all of the below systems have been reviewed with the patient and positives are indicated with bold text General: chills, fever or night sweats Eyes: blurry vision or double vision ENT: epistaxis or sore throat Allergy/Immunology: itchy/watery eyes or nasal congestion Hematologic/Lymphatic: bleeding problems, blood clots or swollen lymph nodes Endocrine: temperature intolerance or unexpected weight changes Breast: new or changing breast lumps or nipple discharge Resp: cough, shortness of breath, or wheezing CV: chest pain or dyspnea on exertion GI: as per HPI GU: dysuria, trouble voiding, or hematuria MSK: joint pain or joint stiffness Neuro: TIA or stroke symptoms Derm: pruritus and skin lesion changes Psych: anxiety and depression  Objective   PE Blood pressure (!) 158/105, pulse 95, temperature 98.3 F (36.8 C), temperature source Oral, resp. rate (!) 23, height 5\' 6"  (1.676 m), weight 115.7 kg, SpO2 100 %. Constitutional: NAD; conversant; no deformities Eyes: Moist conjunctiva; no  lid lag; anicteric; PERRL Neck: Trachea midline; no thyromegaly Lungs: Normal respiratory effort; no tactile fremitus CV: RRR; no palpable thrills; no pitting edema GI: Abd Soft, nontender; no palpable hepatosplenomegaly MSK: Normal range of motion of extremities; no clubbing/cyanosis Psychiatric: Appropriate affect; alert and oriented x3 Lymphatic: No palpable cervical or axillary lymphadenopathy  No results found for this or any previous visit (from the past 24 hour(s)).  Imaging Orders  No imaging studies ordered today     Assessment and Plan   Charlene Martinez is an 45 y.o. female seen fo bariatric surgery consultation. We had a long discussion about current bariatric surgeries and the surgeries I offer: sleeve gastrectomy and gastric bypass. We discussed the procedures themselves as well as the risks, benefits, and alternatives. I used the [facility] bariatric surgery risk-benefit calculator to facilitate our discussion. After full discussion all questions instrumentation states she would like to pursue a robotic gastric bypass. We will enter her into the preoperative pathway including the following: - Labwork - Dietary consult - Completed with [facility], RD - Chest x-ray - 08/18/20 negative - EKG - NSR 08/18/20 - Evaluation by primary care physician (hasn't seen a doctor in about 7 years) - Does not need a sleep study as she only scored a 2 on her stop bang questionnaire - Psychology evaluation  - Today she presents for upper endoscopy.  I described the procedure of upper endoscopy as well as its risks, benefits, and alternatives and the justification for performing these in my preoperative bariatric patients. I will examine the foraminotomy, biopsy for H. pylori, and evaluate for any evidence of esophagitis or other abnormality of the foregut.  After full discussion all questions answered the patient and consent to proceed.    Felicie Morn, MD  [facility] Use [email or web address] to contact on call provider

## 2021-05-18 ENCOUNTER — Ambulatory Visit (HOSPITAL_COMMUNITY): Payer: 59 | Admitting: Anesthesiology

## 2021-05-18 ENCOUNTER — Encounter (HOSPITAL_COMMUNITY): Payer: Self-pay | Admitting: Surgery

## 2021-05-18 ENCOUNTER — Encounter (HOSPITAL_COMMUNITY)
Admission: RE | Disposition: A | Discharge status: Home / Self Care | Payer: Self-pay | Source: Ambulatory Visit | Attending: Surgery

## 2021-05-18 ENCOUNTER — Other Ambulatory Visit: Payer: Self-pay

## 2021-05-18 ENCOUNTER — Ambulatory Visit (HOSPITAL_COMMUNITY)
Admission: RE | Admit: 2021-05-18 | Discharge: 2021-05-18 | Disposition: A | Discharge status: Home / Self Care | Payer: 59 | Source: Ambulatory Visit | Attending: Surgery | Admitting: Surgery

## 2021-05-18 DIAGNOSIS — Z6841 Body Mass Index (BMI) 40.0 and over, adult: Secondary | ICD-10-CM | POA: Insufficient documentation

## 2021-05-18 DIAGNOSIS — K219 Gastro-esophageal reflux disease without esophagitis: Secondary | ICD-10-CM | POA: Insufficient documentation

## 2021-05-18 HISTORY — PX: BIOPSY: SHX5522

## 2021-05-18 HISTORY — PX: ESOPHAGOGASTRODUODENOSCOPY: SHX5428

## 2021-05-18 SURGERY — EGD (ESOPHAGOGASTRODUODENOSCOPY)
Anesthesia: Monitor Anesthesia Care

## 2021-05-18 MED ORDER — LIDOCAINE HCL (CARDIAC) PF 100 MG/5ML IV SOSY
PREFILLED_SYRINGE | INTRAVENOUS | Status: DC | PRN
  Administered 2021-05-18: 100 mg via INTRAVENOUS

## 2021-05-18 MED ORDER — LIDOCAINE HCL (CARDIAC) PF 100 MG/5ML IV SOSY: PREFILLED_SYRINGE | INTRAVENOUS | Status: DC | PRN

## 2021-05-18 MED ORDER — LACTATED RINGERS IV SOLN
INTRAVENOUS | Status: AC | PRN
  Administered 2021-05-18: 1000 mL via INTRAVENOUS

## 2021-05-18 MED ORDER — ONDANSETRON HCL 4 MG/2ML IJ SOLN
INTRAMUSCULAR | Status: DC | PRN
  Administered 2021-05-18: 4 mg via INTRAVENOUS

## 2021-05-18 MED ORDER — GLYCOPYRROLATE 0.2 MG/ML IJ SOLN
INTRAMUSCULAR | Status: DC | PRN
  Administered 2021-05-18: .1 mg via INTRAVENOUS

## 2021-05-18 MED ORDER — DEXAMETHASONE SODIUM PHOSPHATE 10 MG/ML IJ SOLN
INTRAMUSCULAR | Status: DC | PRN
  Administered 2021-05-18: 8 mg via INTRAVENOUS

## 2021-05-18 MED ORDER — PROPOFOL 1000 MG/100ML IV EMUL
INTRAVENOUS | Status: AC
  Filled 2021-05-18: qty 100

## 2021-05-18 MED ORDER — PROPOFOL 500 MG/50ML IV EMUL
INTRAVENOUS | Status: DC | PRN
  Administered 2021-05-18: 425 ug/kg/min via INTRAVENOUS

## 2021-05-18 NOTE — Discharge Instructions (Signed)
YOU HAD AN ENDOSCOPIC PROCEDURE TODAY: Refer to the procedure report and other information in the discharge instructions given to you for any specific questions about what was found during the examination. If this information does not answer your questions, please call [facility] office at [phone] to clarify.   YOU SHOULD EXPECT: Some feelings of bloating in the abdomen. Passage of more gas than usual. Walking can help get rid of the air that was put into your GI tract during the procedure and reduce the bloating.  DIET: Your first meal following the procedure should be a light meal and then it is ok to progress to your normal diet. A half-sandwich or bowl of soup is an example of a good first meal. Heavy or fried foods are harder to digest and may make you feel nauseous or bloated. Drink plenty of fluids but you should avoid alcoholic beverages for 24 hours. If you had a esophageal dilation, please see attached instructions for diet.    ACTIVITY: Your care partner should take you home directly after the procedure. You should plan to take it easy, moving slowly for the rest of the day. You can resume normal activity the day after the procedure however YOU SHOULD NOT DRIVE, use power tools, machinery or perform tasks that involve climbing or major physical exertion for 24 hours (because of the sedation medicines used during the test).   SYMPTOMS TO REPORT IMMEDIATELY: A general surgeon can be reached at any hour. Please call [phone]  for any of the following symptoms: Following upper endoscopy (EGD, EUS, ERCP, esophageal dilation) Vomiting of blood or coffee ground material  New, significant abdominal pain  New, significant chest pain or pain under the shoulder blades  Painful or persistently difficult swallowing  New shortness of breath  Black, tarry-looking or red, bloody stools  FOLLOW UP:  If any biopsies were taken you will be contacted by phone or by letter within the  next 1-3 weeks. Call [phone]  if you have not heard about the biopsies in 3 weeks.  Please also call with any specific questions about appointments or follow up tests.

## 2021-05-18 NOTE — Anesthesia Postprocedure Evaluation (Signed)
Anesthesia Post Note  Patient: Charlene Martinez  Procedure(s) Performed: ESOPHAGOGASTRODUODENOSCOPY (EGD) BIOPSY     Patient location during evaluation: PACU Anesthesia Type: MAC Level of consciousness: awake and alert Pain management: pain level controlled Vital Signs Assessment: post-procedure vital signs reviewed and stable Respiratory status: spontaneous breathing, nonlabored ventilation, respiratory function stable and patient connected to nasal cannula oxygen Cardiovascular status: stable and blood pressure returned to baseline Postop Assessment: no apparent nausea or vomiting Anesthetic complications: no   No notable events documented.  Last Vitals:  Vitals:   05/18/21 0840 05/18/21 0850  BP: (!) 157/85 (!) 150/80  Pulse: 88 85  Resp: (!) 22 (!) 27  Temp:    SpO2: 98% 97%    Last Pain:  Vitals:   05/18/21 0850  TempSrc:   PainSc: 0-No pain                 Jaiya Mooradian S

## 2021-05-18 NOTE — Op Note (Signed)
[facility] Patient Name: Charlene Martinez Procedure Date: 05/18/2021 MRN: [id number] Attending MD: Felicie Morn ,  Date of Birth: Apr 05, 1976 CSN: [id number] Age: 45 Admit Type: Outpatient Procedure:                Upper GI endoscopy Indications:              Morbid obesity Providers:                Felicie Morn, Ladonna Snide, Technician Referring MD:              Medicines:                Monitored Anesthesia Care Complications:            No immediate complications. Estimated Blood Loss:     Estimated blood loss was minimal. Procedure:                Pre-Anesthesia Assessment:                           - Prior to the procedure, a History and Physical                            was performed, and patient medications and                            allergies were reviewed. The risks and benefits of                            the procedure and the sedation options and risks                            were discussed with the patient. All questions were                            answered and informed consent was obtained. Patient                            identification and proposed procedure were verified                            by the physician in the pre-procedure area. Mental                            Status Examination: alert and oriented. Airway                            Examination: normal oropharyngeal airway and neck                            mobility. Respiratory Examination: clear to                            auscultation. CV Examination: normal.  Prophylactic                            Antibiotics: The patient does not require                            prophylactic antibiotics. Prior Anticoagulants: The                            patient has taken no previous anticoagulant or                            antiplatelet agents. ASA Grade Assessment: II - A                            patient with mild  systemic disease. After reviewing                            the risks and benefits, the patient was deemed in                            satisfactory condition to undergo the procedure.                            The anesthesia plan was to use monitored anesthesia                            care (MAC). Immediately prior to administration of                            medications, the patient was re-assessed for                            adequacy to receive sedatives. The heart rate,                            respiratory rate, oxygen saturations, blood                            pressure, adequacy of pulmonary ventilation, and                            response to care were monitored throughout the                            procedure. The physical status of the patient was                            re-assessed after the procedure.                           After obtaining informed consent, the endoscope was  passed under direct vision. Throughout the                            procedure, the patient's blood pressure, pulse, and                            oxygen saturations were monitored continuously. The                            [id number]-[id number] ([id number] Olympus endoscope was introduced                            through the mouth, and advanced to the second part                            of duodenum. The upper GI endoscopy was                            accomplished without difficulty. The patient                            tolerated the procedure well. Scope In: Scope Out: Findings:      The examined esophagus was normal.      The gastroesophageal flap valve was visualized endoscopically and       classified as Hill Grade I (prominent fold, tight to endoscope).      The entire examined stomach was normal. Biopsies were taken with a cold       forceps for Helicobacter pylori testing. Verification of patient       identification for the specimen was done.  Estimated blood loss was       minimal.      The in the duodenum was normal. Impression:               - Normal esophagus.                           - Gastroesophageal flap valve classified as Hill                            Grade I (prominent fold, tight to endoscope).                           - Normal stomach. Biopsied.                           - Normal.                           - Appears to be a good candidate for gastric bypass. Moderate Sedation:      Moderate (conscious) sedation was personally administered by an       anesthesia professional. The following parameters were monitored: oxygen       saturation, heart rate, blood pressure, and response to care. Total       physician intraservice time was 15 minutes. Recommendation:           - Discharge patient to  home.                           - Resume previous diet. Procedure Code(s):        --- Professional ---                           [phone], Esophagogastroduodenoscopy, flexible,                            transoral; with biopsy, single or multiple Diagnosis Code(s):        --- Professional ---                           E66.01, Morbid (severe) obesity due to excess                            calories CPT copyright 2019 American Medical Association. All rights reserved. The codes documented in this report are preliminary and upon coder review may  be revised to meet current compliance requirements. [place],  05/18/2021 8:27:18 AM This report has been signed electronically. Number of Addenda: 0

## 2021-05-18 NOTE — Anesthesia Preprocedure Evaluation (Signed)
Anesthesia Evaluation  Patient identified by MRN, date of birth, ID band Patient awake    Reviewed: Allergy & Precautions, H&P , NPO status , Patient's Chart, lab work & pertinent test results  Airway Mallampati: II  TM Distance: >3 FB Neck ROM: Full    Dental no notable dental hx.    Pulmonary neg pulmonary ROS,    Pulmonary exam normal breath sounds clear to auscultation       Cardiovascular negative cardio ROS Normal cardiovascular exam Rhythm:Regular Rate:Normal     Neuro/Psych negative neurological ROS  negative psych ROS   GI/Hepatic Neg liver ROS, GERD  ,  Endo/Other  Morbid obesity  Renal/GU negative Renal ROS  negative genitourinary   Musculoskeletal negative musculoskeletal ROS (+)   Abdominal   Peds negative pediatric ROS (+)  Hematology negative hematology ROS (+)   Anesthesia Other Findings   Reproductive/Obstetrics negative OB ROS                             Anesthesia Physical Anesthesia Plan  ASA: 2  Anesthesia Plan: MAC   Post-op Pain Management: Minimal or no pain anticipated   Induction: Intravenous  PONV Risk Score and Plan: 2 and Propofol infusion  Airway Management Planned: Simple Face Mask  Additional Equipment:   Intra-op Plan:   Post-operative Plan:   Informed Consent: I have reviewed the patients History and Physical, chart, labs and discussed the procedure including the risks, benefits and alternatives for the proposed anesthesia with the patient or authorized representative who has indicated his/her understanding and acceptance.     Dental advisory given  Plan Discussed with: CRNA and Surgeon  Anesthesia Plan Comments:         Anesthesia Quick Evaluation

## 2021-05-18 NOTE — Anesthesia Procedure Notes (Signed)
Procedure Name: MAC Date/Time: 05/18/2021 8:12 AM Performed by: Lissa Morales, CRNA Pre-anesthesia Checklist: Patient identified, Emergency Drugs available, Suction available and Patient being monitored Patient Re-evaluated:Patient Re-evaluated prior to induction Oxygen Delivery Method: Simple face mask Placement Confirmation: positive ETCO2

## 2021-05-18 NOTE — Transfer of Care (Signed)
Immediate Anesthesia Transfer of Care Note  Patient: Charlene Martinez  Procedure(s) Performed: ESOPHAGOGASTRODUODENOSCOPY (EGD) BIOPSY  Patient Location: PACU  Anesthesia Type:MAC  Level of Consciousness: awake alert cooperative  Airway & Oxygen Therapy: Patient Spontanous Breathing and Patient connected to face mask oxygen  Post-op Assessment: Report given to RN, Post -op Vital signs reviewed and stable and Patient moving all extremities X 4  Post vital signs: stable  Last Vitals:  Vitals Value Taken Time  BP 141/79 05/18/21 0830  Temp    Pulse 98 05/18/21 0831  Resp 23 05/18/21 0831  SpO2 100 % 05/18/21 0831  Vitals shown include unvalidated device data.  Last Pain:  Vitals:   05/18/21 0830  TempSrc:   PainSc: 0-No pain         Complications: No notable events documented.

## 2021-05-19 ENCOUNTER — Encounter (HOSPITAL_COMMUNITY): Payer: Self-pay | Admitting: Surgery

## 2021-05-20 LAB — SURGICAL PATHOLOGY

## 2021-06-28 ENCOUNTER — Encounter: Payer: 59 | Attending: Surgery | Admitting: Skilled Nursing Facility1

## 2021-06-28 ENCOUNTER — Other Ambulatory Visit: Payer: Self-pay

## 2021-06-28 DIAGNOSIS — E669 Obesity, unspecified: Secondary | ICD-10-CM | POA: Diagnosis present

## 2021-06-28 NOTE — Progress Notes (Signed)
Pre-Operative Nutrition Class:    Patient was seen on 06/28/2021 for Pre-Operative Bariatric Surgery Education at [facility]    Surgery date:  Surgery type: RYGB Start weight at [facility]: 279.5 Weight today: 274.2  Samples given per MNT protocol. Patient educated on appropriate usage:  Ensure max exp: August 11, 2021 Ensure max lot: [id number]  Chewable bariatric advantage: advanced multi EA exp: 08/23 Chewable bariatric advantage: advanced multi EA lot: [id number]  Bariatric advantage calcium citrate exp: 02/23 Bariatric advantage calcium citrate lot: [id number]   The following the learning objectives were met by the patient during this course: Identify Pre-Op Dietary Goals and will begin 2 weeks pre-operatively Identify appropriate sources of fluids and proteins  State protein recommendations and appropriate sources pre and post-operatively Identify Post-Operative Dietary Goals and will follow for 2 weeks post-operatively Identify appropriate multivitamin and calcium sources Describe the need for physical activity post-operatively and will follow MD recommendations State when to call healthcare provider regarding medication questions or post-operative complications When having a diagnosis of diabetes understanding hypoglycemia symptoms and the inclusion of 1 complex carbohydrate per meal  Handouts given during class include: Pre-Op Bariatric Surgery Diet Handout Protein Shake Handout Post-Op Bariatric Surgery Nutrition Handout BELT Program Information Flyer Support Group Information Flyer [facility] Bariatric Supplements Price List  Follow-Up Plan: Patient will follow-up at [facility] 2 weeks post operatively for diet advancement per MD.

## 2021-07-05 NOTE — Progress Notes (Signed)
Surgery orders requested via Epic inbox. °

## 2021-07-06 ENCOUNTER — Ambulatory Visit: Payer: Self-pay | Admitting: Surgery

## 2021-07-06 DIAGNOSIS — Z01818 Encounter for other preprocedural examination: Secondary | ICD-10-CM

## 2021-07-12 NOTE — Patient Instructions (Signed)
DUE TO COVID-19 ONLY ONE VISITOR IS ALLOWED TO COME WITH YOU AND STAY IN THE WAITING ROOM ONLY DURING PRE OP AND PROCEDURE DAY OF SURGERY IF YOU ARE GOING HOME AFTER SURGERY. IF YOU ARE SPENDING THE NIGHT 2 PEOPLE MAY VISIT WITH YOU IN YOUR PRIVATE ROOM AFTER SURGERY UNTIL VISITING  HOURS ARE OVER AT 8:00 PM AND 1  VISITOR  MAY  SPEND THE NIGHT.   YOU NEED TO HAVE A COVID 19 TEST ON_2/9/23______ @__9 :00_____, THIS TEST MUST BE DONE BEFORE SURGERY,                Charlene Martinez     Your procedure is scheduled on: 07/26/21   Report to [facility] Main  Entrance   Report to admitting at   8:45 AM     Call this number if you have problems the morning of surgery [phone]    Remember: Do not eat food  :After Midnight the night before your surgery,   You may have clear liquids from midnight until ---.  MORNING OF SURGERY DRINK:   DRINK 1 G2 drink BEFORE YOU LEAVE HOME, DRINK ALL OF THE  G2 DRINK AT ONE TIME.    NO SOLID FOOD AFTER 6:00 PM THE NIGHT BEFORE YOUR SURGERY.   YOU MAY DRINK CLEAR FLUIDS. Until 8:00 am  THE G2 DRINK YOU DRINK BEFORE YOU LEAVE HOME WILL BE THE LAST FLUIDS YOU DRINK BEFORE SURGERY.    PAIN IS EXPECTED AFTER SURGERY AND WILL NOT BE COMPLETELY ELIMINATED.   AMBULATION AND TYLENOL WILL HELP REDUCE INCISIONAL AND GAS PAIN. MOVEMENT IS KEY!  YOU ARE EXPECTED TO BE OUT OF BED WITHIN 4 HOURS OF ADMISSION TO YOUR PATIENT ROOM.  SITTING IN THE RECLINER THROUGHOUT THE DAY IS IMPORTANT FOR DRINKING FLUIDS AND MOVING GAS THROUGHOUT THE GI TRACT.  COMPRESSION STOCKINGS SHOULD BE WORN [facility] STAY UNLESS YOU ARE WALKING.   INCENTIVE SPIROMETER SHOULD BE USED EVERY HOUR WHILE AWAKE TO DECREASE POST-OPERATIVE COMPLICATIONS SUCH AS PNEUMONIA.  WHEN DISCHARGED HOME, IT IS IMPORTANT TO CONTINUE TO WALK EVERY HOUR AND USE THE INCENTIVE SPIROMETER EVERY HOUR.      CLEAR LIQUID DIET   Foods Allowed                                                                      Foods Excluded  Coffee and tea, regular and decaf                             liquids that you cannot  Plain Jell-O any favor except red or purple                                           see through such as: Fruit ices (not with fruit pulp)                                     milk, soups, orange juice  Iced Popsicles  All solid food Carbonated beverages, regular and diet                                    Cranberry, grape and apple juices Sports drinks like Gatorade Lightly seasoned clear broth or consume(fat free) Sugar     BRUSH YOUR TEETH MORNING OF SURGERY AND RINSE YOUR MOUTH OUT, NO CHEWING GUM CANDY OR MINTS.     Take these medicines the morning of surgery with A SIP OF WATER: none                                You may not have any metal on your body including hair pins and              piercings  Do not wear jewelry, make-up, lotions, powders or perfumes, deodorant             Do not wear nail polish on your fingernails.  Do not shave  48 hours prior to surgery.                 Do not bring valuables to the hospital. [place] IS NOT             RESPONSIBLE   FOR VALUABLES.  Contacts, dentures or bridgework may not be worn into surgery.  Leave suitcase in the car. After surgery it may be brought to your room.      Special Instructions: N/A              Please read over the following fact sheets you were given: _____________________________________________________________________             [facility] - Preparing for Surgery Before surgery, you can play an important role.  Because skin is not sterile, your skin needs to be as free of germs as possible.  You can reduce the number of germs on your skin by washing with CHG (chlorahexidine gluconate) soap before surgery.  CHG is an antiseptic cleaner which kills germs and bonds with the skin to continue killing germs even after washing. Please DO NOT use if  you have an allergy to CHG or antibacterial soaps.  If your skin becomes reddened/irritated stop using the CHG and inform your nurse when you arrive at Short Stay. Do not shave (including legs and underarms) for at least 48 hours prior to the first CHG shower.  Please follow these instructions carefully:  1.  Shower with CHG Soap the night before surgery and the  morning of Surgery.  2.  If you choose to wash your hair, wash your hair first as usual with your  normal  shampoo.  3.  After you shampoo, rinse your hair and body thoroughly to remove the  shampoo.                            4.  Use CHG as you would any other liquid soap.  You can apply chg directly  to the skin and wash                       Gently with a scrungie or clean washcloth.  5.  Apply the CHG Soap to your body ONLY FROM THE NECK DOWN.   Do not use on face/ open  Wound or open sores. Avoid contact with eyes, ears mouth and genitals (private parts).                       Wash face,  Genitals (private parts) with your normal soap.             6.  Wash thoroughly, paying special attention to the area where your surgery  will be performed.  7.  Thoroughly rinse your body with warm water from the neck down.  8.  DO NOT shower/wash with your normal soap after using and rinsing off  the CHG Soap.                9.  Pat yourself dry with a clean towel.            10.  Wear clean pajamas.            11.  Place clean sheets on your bed the night of your first shower and do not  sleep with pets. Day of Surgery : Do not apply any lotions/deodorants the morning of surgery.  Please wear clean clothes to the hospital/surgery center.  FAILURE TO FOLLOW THESE INSTRUCTIONS MAY RESULT IN THE CANCELLATION OF YOUR SURGERY PATIENT SIGNATURE_________________________________  NURSE SIGNATURE__________________________________  ________________________________________________________________________   Rogelia MireIncentive  Spirometer  An incentive spirometer is a tool that can help keep your lungs clear and active. This tool measures how well you are filling your lungs with each breath. Taking long deep breaths may help reverse or decrease the chance of developing breathing (pulmonary) problems (especially infection) following: A long period of time when you are unable to move or be active. BEFORE THE PROCEDURE  If the spirometer includes an indicator to show your best effort, your nurse or respiratory therapist will set it to a desired goal. If possible, sit up straight or lean slightly forward. Try not to slouch. Hold the incentive spirometer in an upright position. INSTRUCTIONS FOR USE  Sit on the edge of your bed if possible, or sit up as far as you can in bed or on a chair. Hold the incentive spirometer in an upright position. Breathe out normally. Place the mouthpiece in your mouth and seal your lips tightly around it. Breathe in slowly and as deeply as possible, raising the piston or the ball toward the top of the column. Hold your breath for 3-5 seconds or for as long as possible. Allow the piston or ball to fall to the bottom of the column. Remove the mouthpiece from your mouth and breathe out normally. Rest for a few seconds and repeat Steps 1 through 7 at least 10 times every 1-2 hours when you are awake. Take your time and take a few normal breaths between deep breaths. The spirometer may include an indicator to show your best effort. Use the indicator as a goal to work toward during each repetition. After each set of 10 deep breaths, practice coughing to be sure your lungs are clear. If you have an incision (the cut made at the time of surgery), support your incision when coughing by placing a pillow or rolled up towels firmly against it. Once you are able to get out of bed, walk around indoors and cough well. You may stop using the incentive spirometer when instructed by your caregiver.  RISKS AND  COMPLICATIONS Take your time so you do not get dizzy or light-headed. If you are in pain, you may need to take or  ask for pain medication before doing incentive spirometry. It is harder to take a deep breath if you are having pain. AFTER USE Rest and breathe slowly and easily. It can be helpful to keep track of a log of your progress. Your caregiver can provide you with a simple table to help with this. If you are using the spirometer at home, follow these instructions: [place] IF:  You are having difficultly using the spirometer. You have trouble using the spirometer as often as instructed. Your pain medication is not giving enough relief while using the spirometer. You develop fever of 100.5 F (38.1 C) or higher. SEEK IMMEDIATE MEDICAL CARE IF:  You cough up bloody sputum that had not been present before. You develop fever of 102 F (38.9 C) or greater. You develop worsening pain at or near the incision site. MAKE SURE YOU:  Understand these instructions. Will watch your condition. Will get help right away if you are not doing well or get worse. Document Released: 10/10/2006 Document Revised: 08/22/2011 Document Reviewed: 12/11/2006 [employer] Patient Information 2014 [place], [place].   ________________________________________________________________________

## 2021-07-13 ENCOUNTER — Other Ambulatory Visit: Payer: Self-pay

## 2021-07-13 ENCOUNTER — Encounter (HOSPITAL_COMMUNITY): Payer: Self-pay

## 2021-07-13 ENCOUNTER — Encounter (HOSPITAL_COMMUNITY)
Admission: RE | Admit: 2021-07-13 | Discharge: 2021-07-13 | Disposition: A | Discharge status: Home / Self Care | Payer: 59 | Source: Ambulatory Visit | Attending: Surgery | Admitting: Surgery

## 2021-07-13 VITALS — BP 169/100 | HR 87 | Temp 98.1°F | Resp 18 | Ht 65.0 in | Wt 270.0 lb

## 2021-07-13 DIAGNOSIS — Z01812 Encounter for preprocedural laboratory examination: Secondary | ICD-10-CM | POA: Insufficient documentation

## 2021-07-13 DIAGNOSIS — Z01818 Encounter for other preprocedural examination: Secondary | ICD-10-CM

## 2021-07-13 HISTORY — DX: Essential (primary) hypertension: I10

## 2021-07-13 LAB — CBC WITH DIFFERENTIAL/PLATELET
Abs Immature Granulocytes: 0.02 10*3/uL (ref 0.00–0.07)
Basophils Absolute: 0 10*3/uL (ref 0.0–0.1)
Basophils Relative: 0 %
Eosinophils Absolute: 0.1 10*3/uL (ref 0.0–0.5)
Eosinophils Relative: 2 %
HCT: 41.4 % (ref 36.0–46.0)
Hemoglobin: 14 g/dL (ref 12.0–15.0)
Immature Granulocytes: 0 %
Lymphocytes Relative: 26 %
Lymphs Abs: 1.7 10*3/uL (ref 0.7–4.0)
MCH: 29 pg (ref 26.0–34.0)
MCHC: 33.8 g/dL (ref 30.0–36.0)
MCV: 85.9 fL (ref 80.0–100.0)
Monocytes Absolute: 0.4 10*3/uL (ref 0.1–1.0)
Monocytes Relative: 6 %
Neutro Abs: 4.2 10*3/uL (ref 1.7–7.7)
Neutrophils Relative %: 66 %
Platelets: 192 10*3/uL (ref 150–400)
RBC: 4.82 MIL/uL (ref 3.87–5.11)
RDW: 12.6 % (ref 11.5–15.5)
WBC: 6.4 10*3/uL (ref 4.0–10.5)
nRBC: 0 % (ref 0.0–0.2)

## 2021-07-13 LAB — COMPREHENSIVE METABOLIC PANEL
ALT: 33 U/L (ref 0–44)
AST: 21 U/L (ref 15–41)
Albumin: 4.2 g/dL (ref 3.5–5.0)
Alkaline Phosphatase: 45 U/L (ref 38–126)
Anion gap: 6 (ref 5–15)
BUN: 13 mg/dL (ref 6–20)
CO2: 22 mmol/L (ref 22–32)
Calcium: 8.9 mg/dL (ref 8.9–10.3)
Chloride: 107 mmol/L (ref 98–111)
Creatinine, Ser: 0.57 mg/dL (ref 0.44–1.00)
GFR, Estimated: >60 mL/min (ref >60)
Glucose, Bld: 104 mg/dL — ABNORMAL HIGH (ref 70–99)
Potassium: 3.7 mmol/L (ref 3.5–5.1)
Sodium: 135 mmol/L (ref 135–145)
Total Bilirubin: 1.2 mg/dL (ref 0.3–1.2)
Total Protein: 7.6 g/dL (ref 6.5–8.1)

## 2021-07-13 LAB — TYPE AND SCREEN
ABO/RH(D): B POS
Antibody Screen: NEGATIVE

## 2021-07-13 NOTE — Progress Notes (Signed)
COVID test- 07/22/21 at 9:00   PCP - Dr. Marcina Millard Cardiologist - none  Chest x-ray - 08/18/20-epic EKG - 08/18/20-epic Stress Test - no ECHO - no Cardiac Cath - no Pacemaker/ICD device last checked:NA  Sleep Study - no CPAP -   Fasting Blood Sugar - NA Checks Blood Sugar _____ times a day  Blood Thinner Instructions:NA Aspirin Instructions: Last Dose:  Anesthesia review: yes  Patient denies shortness of breath, fever, cough and chest pain at PAT appointment Pt's BP was up at the pat appointment. Pt will monitor it at home and call Dr. Richardson Dopp if it remains high. She is able to climb 3-4 flights of stairs, do housework and ADLs without SOB  Patient verbalized understanding of instructions that were given to them at the PAT appointment. Patient was also instructed that they will need to review over the PAT instructions again at home before surgery. yes

## 2021-07-22 ENCOUNTER — Encounter (HOSPITAL_COMMUNITY)
Admission: RE | Admit: 2021-07-22 | Discharge: 2021-07-22 | Disposition: A | Discharge status: Home / Self Care | Payer: 59 | Source: Ambulatory Visit | Attending: Surgery | Admitting: Surgery

## 2021-07-22 ENCOUNTER — Other Ambulatory Visit: Payer: Self-pay

## 2021-07-22 DIAGNOSIS — Z01812 Encounter for preprocedural laboratory examination: Secondary | ICD-10-CM | POA: Insufficient documentation

## 2021-07-22 DIAGNOSIS — Z01818 Encounter for other preprocedural examination: Secondary | ICD-10-CM

## 2021-07-22 DIAGNOSIS — Z20822 Contact with and (suspected) exposure to covid-19: Secondary | ICD-10-CM | POA: Diagnosis not present

## 2021-07-22 LAB — SARS CORONAVIRUS 2 (TAT 6-24 HRS): SARS Coronavirus 2: NEGATIVE

## 2021-07-26 ENCOUNTER — Encounter (HOSPITAL_COMMUNITY)
Admission: RE | Disposition: A | Discharge status: Home / Self Care | Payer: Self-pay | Source: Home / Self Care | Attending: Surgery

## 2021-07-26 ENCOUNTER — Inpatient Hospital Stay (HOSPITAL_COMMUNITY): Payer: 59 | Admitting: Anesthesiology

## 2021-07-26 ENCOUNTER — Encounter (HOSPITAL_COMMUNITY): Payer: Self-pay | Admitting: Surgery

## 2021-07-26 ENCOUNTER — Other Ambulatory Visit: Payer: Self-pay

## 2021-07-26 ENCOUNTER — Inpatient Hospital Stay (HOSPITAL_COMMUNITY)
Admission: RE | Admit: 2021-07-26 | Discharge: 2021-07-27 | DRG: 621 | Disposition: A | Discharge status: Home / Self Care | Payer: 59 | Attending: Surgery | Admitting: Surgery

## 2021-07-26 DIAGNOSIS — Z87892 Personal history of anaphylaxis: Secondary | ICD-10-CM | POA: Diagnosis not present

## 2021-07-26 DIAGNOSIS — Z87891 Personal history of nicotine dependence: Secondary | ICD-10-CM | POA: Diagnosis not present

## 2021-07-26 DIAGNOSIS — Z975 Presence of (intrauterine) contraceptive device: Secondary | ICD-10-CM | POA: Diagnosis not present

## 2021-07-26 DIAGNOSIS — Z9851 Tubal ligation status: Secondary | ICD-10-CM | POA: Diagnosis not present

## 2021-07-26 DIAGNOSIS — K219 Gastro-esophageal reflux disease without esophagitis: Secondary | ICD-10-CM | POA: Diagnosis present

## 2021-07-26 DIAGNOSIS — Z79899 Other long term (current) drug therapy: Secondary | ICD-10-CM | POA: Diagnosis not present

## 2021-07-26 DIAGNOSIS — Z6841 Body Mass Index (BMI) 40.0 and over, adult: Secondary | ICD-10-CM

## 2021-07-26 DIAGNOSIS — Z882 Allergy status to sulfonamides status: Secondary | ICD-10-CM

## 2021-07-26 DIAGNOSIS — I10 Essential (primary) hypertension: Secondary | ICD-10-CM | POA: Diagnosis present

## 2021-07-26 DIAGNOSIS — Z01818 Encounter for other preprocedural examination: Secondary | ICD-10-CM

## 2021-07-26 LAB — CBC
HCT: 39.5 % (ref 36.0–46.0)
Hemoglobin: 13.2 g/dL (ref 12.0–15.0)
MCH: 29.3 pg (ref 26.0–34.0)
MCHC: 33.4 g/dL (ref 30.0–36.0)
MCV: 87.6 fL (ref 80.0–100.0)
Platelets: 208 10*3/uL (ref 150–400)
RBC: 4.51 MIL/uL (ref 3.87–5.11)
RDW: 13 % (ref 11.5–15.5)
WBC: 16.3 10*3/uL — ABNORMAL HIGH (ref 4.0–10.5)
nRBC: 0 % (ref 0.0–0.2)

## 2021-07-26 LAB — HEMOGLOBIN AND HEMATOCRIT, BLOOD
HCT: 39.3 % (ref 36.0–46.0)
Hemoglobin: 13 g/dL (ref 12.0–15.0)

## 2021-07-26 LAB — ABO/RH: ABO/RH(D): B POS

## 2021-07-26 LAB — CREATININE, SERUM
Creatinine, Ser: 0.58 mg/dL (ref 0.44–1.00)
GFR, Estimated: >60 mL/min (ref >60)

## 2021-07-26 LAB — HCG, SERUM, QUALITATIVE: Preg, Serum: NEGATIVE

## 2021-07-26 SURGERY — CREATION, GASTRIC BYPASS, ROUX-EN-Y, ROBOT-ASSISTED
Anesthesia: General

## 2021-07-26 MED ORDER — ENSURE MAX PROTEIN PO LIQD
2.0000 [oz_av] | ORAL | Status: DC
  Administered 2021-07-27 (×4): 2 [oz_av] via ORAL

## 2021-07-26 MED ORDER — FENTANYL CITRATE (PF) 100 MCG/2ML IJ SOLN
INTRAMUSCULAR | Status: AC
  Filled 2021-07-26: qty 2

## 2021-07-26 MED ORDER — LIDOCAINE HCL (PF) 2 % IJ SOLN
INTRAMUSCULAR | Status: AC
  Filled 2021-07-26: qty 15

## 2021-07-26 MED ORDER — SUGAMMADEX SODIUM 200 MG/2ML IV SOLN
INTRAVENOUS | Status: DC | PRN
  Administered 2021-07-26: 247.2 mg via INTRAVENOUS

## 2021-07-26 MED ORDER — MIDAZOLAM HCL 2 MG/2ML IJ SOLN
INTRAMUSCULAR | Status: AC
  Filled 2021-07-26: qty 2

## 2021-07-26 MED ORDER — SODIUM CHLORIDE 0.9 % IV SOLN
2.0000 g | INTRAVENOUS | Status: AC
  Administered 2021-07-26: 2 g via INTRAVENOUS
  Filled 2021-07-26: qty 2

## 2021-07-26 MED ORDER — ROCURONIUM BROMIDE 10 MG/ML (PF) SYRINGE
PREFILLED_SYRINGE | INTRAVENOUS | Status: AC
  Filled 2021-07-26: qty 10

## 2021-07-26 MED ORDER — LACTATED RINGERS IV SOLN: INTRAVENOUS | Status: DC

## 2021-07-26 MED ORDER — ACETAMINOPHEN 500 MG PO TABS
1000.0000 mg | ORAL_TABLET | ORAL | Status: AC
  Administered 2021-07-26: 1000 mg via ORAL
  Filled 2021-07-26: qty 2

## 2021-07-26 MED ORDER — SCOPOLAMINE 1 MG/3DAYS TD PT72
1.0000 | MEDICATED_PATCH | Freq: Once | TRANSDERMAL | Status: DC
  Administered 2021-07-26: 1.5 mg via TRANSDERMAL
  Filled 2021-07-26: qty 1

## 2021-07-26 MED ORDER — SIMETHICONE 80 MG PO CHEW
80.0000 mg | CHEWABLE_TABLET | Freq: Four times a day (QID) | ORAL | Status: DC | PRN
  Filled 2021-07-26: qty 1

## 2021-07-26 MED ORDER — STERILE WATER FOR IRRIGATION IR SOLN
Status: DC | PRN
  Administered 2021-07-26: 1000 mL

## 2021-07-26 MED ORDER — ENOXAPARIN SODIUM 40 MG/0.4ML IJ SOSY
40.0000 mg | PREFILLED_SYRINGE | INTRAMUSCULAR | Status: AC
  Administered 2021-07-26: 40 mg via SUBCUTANEOUS
  Filled 2021-07-26: qty 0.4

## 2021-07-26 MED ORDER — ACETAMINOPHEN 160 MG/5ML PO SOLN
1000.0000 mg | Freq: Three times a day (TID) | ORAL | Status: DC
  Administered 2021-07-27: 1000 mg via ORAL
  Filled 2021-07-26: qty 40.6

## 2021-07-26 MED ORDER — PROMETHAZINE HCL 25 MG/ML IJ SOLN
INTRAMUSCULAR | Status: AC
  Administered 2021-07-26: 6.25 mg via INTRAVENOUS
  Filled 2021-07-26: qty 1

## 2021-07-26 MED ORDER — CHLORHEXIDINE GLUCONATE 0.12 % MT SOLN
15.0000 mL | Freq: Once | OROMUCOSAL | Status: AC
  Administered 2021-07-26: 15 mL via OROMUCOSAL

## 2021-07-26 MED ORDER — BUPIVACAINE-EPINEPHRINE (PF) 0.25% -1:200000 IJ SOLN
INTRAMUSCULAR | Status: AC
  Filled 2021-07-26: qty 30

## 2021-07-26 MED ORDER — BUPIVACAINE LIPOSOME 1.3 % IJ SUSP
INTRAMUSCULAR | Status: DC | PRN
  Administered 2021-07-26: 20 mL

## 2021-07-26 MED ORDER — ROCURONIUM BROMIDE 10 MG/ML (PF) SYRINGE
PREFILLED_SYRINGE | INTRAVENOUS | Status: DC | PRN
  Administered 2021-07-26 (×2): 20 mg via INTRAVENOUS
  Administered 2021-07-26: 30 mg via INTRAVENOUS
  Administered 2021-07-26: 60 mg via INTRAVENOUS

## 2021-07-26 MED ORDER — ACETAMINOPHEN 500 MG PO TABS
1000.0000 mg | ORAL_TABLET | Freq: Three times a day (TID) | ORAL | Status: DC
  Administered 2021-07-26: 1000 mg via ORAL
  Filled 2021-07-26: qty 2

## 2021-07-26 MED ORDER — LIDOCAINE HCL (PF) 2 % IJ SOLN
INTRAMUSCULAR | Status: AC
  Filled 2021-07-26: qty 5

## 2021-07-26 MED ORDER — PROPOFOL 10 MG/ML IV BOLUS
INTRAVENOUS | Status: DC | PRN
Start: 2021-07-26 — End: 2021-07-26
  Administered 2021-07-26: 150 mg via INTRAVENOUS

## 2021-07-26 MED ORDER — CHLORHEXIDINE GLUCONATE CLOTH 2 % EX PADS: 6.0000 | MEDICATED_PAD | Freq: Once | CUTANEOUS | Status: DC

## 2021-07-26 MED ORDER — APREPITANT 40 MG PO CAPS
40.0000 mg | ORAL_CAPSULE | ORAL | Status: AC
  Administered 2021-07-26: 40 mg via ORAL
  Filled 2021-07-26: qty 1

## 2021-07-26 MED ORDER — LACTATED RINGERS IV SOLN
INTRAVENOUS | Status: AC | PRN
  Administered 2021-07-26: 1000 mL

## 2021-07-26 MED ORDER — BUPIVACAINE LIPOSOME 1.3 % IJ SUSP: 20.0000 mL | Freq: Once | INTRAMUSCULAR | Status: DC

## 2021-07-26 MED ORDER — DEXAMETHASONE SODIUM PHOSPHATE 10 MG/ML IJ SOLN
INTRAMUSCULAR | Status: AC
  Filled 2021-07-26: qty 1

## 2021-07-26 MED ORDER — FENTANYL CITRATE PF 50 MCG/ML IJ SOSY
PREFILLED_SYRINGE | INTRAMUSCULAR | Status: AC
  Filled 2021-07-26: qty 2

## 2021-07-26 MED ORDER — SUGAMMADEX SODIUM 500 MG/5ML IV SOLN
INTRAVENOUS | Status: AC
  Filled 2021-07-26: qty 5

## 2021-07-26 MED ORDER — DEXAMETHASONE SODIUM PHOSPHATE 10 MG/ML IJ SOLN
INTRAMUSCULAR | Status: DC | PRN
  Administered 2021-07-26: 6 mg via INTRAVENOUS

## 2021-07-26 MED ORDER — PROPOFOL 10 MG/ML IV BOLUS
INTRAVENOUS | Status: AC
  Filled 2021-07-26: qty 20

## 2021-07-26 MED ORDER — BUPIVACAINE-EPINEPHRINE 0.25% -1:200000 IJ SOLN
INTRAMUSCULAR | Status: DC | PRN
  Administered 2021-07-26: 30 mL

## 2021-07-26 MED ORDER — MORPHINE SULFATE (PF) 2 MG/ML IV SOLN
INTRAVENOUS | Status: AC
  Filled 2021-07-26: qty 1

## 2021-07-26 MED ORDER — PANTOPRAZOLE SODIUM 40 MG IV SOLR
40.0000 mg | Freq: Every day | INTRAVENOUS | Status: DC
  Administered 2021-07-26: 40 mg via INTRAVENOUS
  Filled 2021-07-26: qty 10

## 2021-07-26 MED ORDER — 0.9 % SODIUM CHLORIDE (POUR BTL) OPTIME
TOPICAL | Status: DC | PRN
  Administered 2021-07-26: 1000 mL

## 2021-07-26 MED ORDER — FENTANYL CITRATE PF 50 MCG/ML IJ SOSY: 25.0000 ug | PREFILLED_SYRINGE | INTRAMUSCULAR | Status: DC | PRN

## 2021-07-26 MED ORDER — ONDANSETRON HCL 4 MG/2ML IJ SOLN
INTRAMUSCULAR | Status: AC
  Filled 2021-07-26: qty 2

## 2021-07-26 MED ORDER — BUPIVACAINE LIPOSOME 1.3 % IJ SUSP
INTRAMUSCULAR | Status: AC
  Filled 2021-07-26: qty 20

## 2021-07-26 MED ORDER — ENOXAPARIN SODIUM 30 MG/0.3ML IJ SOSY
30.0000 mg | PREFILLED_SYRINGE | Freq: Two times a day (BID) | INTRAMUSCULAR | Status: DC
  Administered 2021-07-26 – 2021-07-27 (×2): 30 mg via SUBCUTANEOUS
  Filled 2021-07-26 (×2): qty 0.3

## 2021-07-26 MED ORDER — MORPHINE SULFATE (PF) 2 MG/ML IV SOLN: 1.0000 mg | INTRAVENOUS | Status: DC | PRN

## 2021-07-26 MED ORDER — ORAL CARE MOUTH RINSE: 15.0000 mL | Freq: Once | OROMUCOSAL | Status: AC

## 2021-07-26 MED ORDER — LIDOCAINE 2% (20 MG/ML) 5 ML SYRINGE
INTRAMUSCULAR | Status: DC | PRN
Start: 2021-07-26 — End: 2021-07-26
  Administered 2021-07-26: 1.5 mg/kg/h via INTRAVENOUS

## 2021-07-26 MED ORDER — MIDAZOLAM HCL 5 MG/5ML IJ SOLN
INTRAMUSCULAR | Status: DC | PRN
  Administered 2021-07-26: 2 mg via INTRAVENOUS

## 2021-07-26 MED ORDER — OXYCODONE HCL 5 MG/5ML PO SOLN: 5.0000 mg | Freq: Four times a day (QID) | ORAL | Status: DC | PRN

## 2021-07-26 MED ORDER — PROMETHAZINE HCL 25 MG/ML IJ SOLN: 6.2500 mg | INTRAMUSCULAR | Status: DC | PRN

## 2021-07-26 MED ORDER — ONDANSETRON HCL 4 MG/2ML IJ SOLN
INTRAMUSCULAR | Status: DC | PRN
Start: 2021-07-26 — End: 2021-07-26
  Administered 2021-07-26: 4 mg via INTRAVENOUS

## 2021-07-26 MED ORDER — LIDOCAINE 2% (20 MG/ML) 5 ML SYRINGE
INTRAMUSCULAR | Status: DC | PRN
  Administered 2021-07-26: 60 mg via INTRAVENOUS

## 2021-07-26 MED ORDER — ONDANSETRON HCL 4 MG/2ML IJ SOLN
4.0000 mg | INTRAMUSCULAR | Status: DC | PRN
  Administered 2021-07-26 – 2021-07-27 (×3): 4 mg via INTRAVENOUS
  Filled 2021-07-26 (×3): qty 2

## 2021-07-26 MED ORDER — FENTANYL CITRATE (PF) 100 MCG/2ML IJ SOLN
INTRAMUSCULAR | Status: DC | PRN
  Administered 2021-07-26: 50 ug via INTRAVENOUS
  Administered 2021-07-26: 100 ug via INTRAVENOUS
  Administered 2021-07-26 (×3): 50 ug via INTRAVENOUS

## 2021-07-26 MED ORDER — FENTANYL CITRATE (PF) 250 MCG/5ML IJ SOLN
INTRAMUSCULAR | Status: AC
  Filled 2021-07-26: qty 5

## 2021-07-26 SURGICAL SUPPLY — 73 items
APPLIER CLIP 5 13 M/L LIGAMAX5 (MISCELLANEOUS)
APPLIER CLIP ROT 10 11.4 M/L (STAPLE)
BLADE SURG SZ11 CARB STEEL (BLADE) ×2 IMPLANT
CANNULA REDUC XI 12-8 STAPL (CANNULA) ×1
CANNULA REDUCER 12-8 DVNC XI (CANNULA) ×1 IMPLANT
CHLORAPREP W/TINT 26 (MISCELLANEOUS) ×2 IMPLANT
CLIP APPLIE 5 13 M/L LIGAMAX5 (MISCELLANEOUS) IMPLANT
CLIP APPLIE ROT 10 11.4 M/L (STAPLE) IMPLANT
COVER SURGICAL LIGHT HANDLE (MISCELLANEOUS) ×2 IMPLANT
COVER TIP SHEARS 8 DVNC (MISCELLANEOUS) ×1 IMPLANT
COVER TIP SHEARS 8MM DA VINCI (MISCELLANEOUS) ×1
DERMABOND ADVANCED (GAUZE/BANDAGES/DRESSINGS) ×1
DERMABOND ADVANCED .7 DNX12 (GAUZE/BANDAGES/DRESSINGS) ×1 IMPLANT
DRAPE ARM DVNC X/XI (DISPOSABLE) ×4 IMPLANT
DRAPE COLUMN DVNC XI (DISPOSABLE) ×1 IMPLANT
DRAPE DA VINCI XI ARM (DISPOSABLE) ×4
DRAPE DA VINCI XI COLUMN (DISPOSABLE) ×1
ELECT REM PT RETURN 15FT ADLT (MISCELLANEOUS) ×2 IMPLANT
GAUZE 4X4 16PLY ~~LOC~~+RFID DBL (SPONGE) ×2 IMPLANT
GLOVE SURG ENC MOIS LTX SZ7.5 (GLOVE) ×4 IMPLANT
GLOVE SURG UNDER LTX SZ8 (GLOVE) ×4 IMPLANT
GOWN STRL REUS W/TWL XL LVL3 (GOWN DISPOSABLE) ×7 IMPLANT
GRASPER SUT TROCAR 14GX15 (MISCELLANEOUS) ×2 IMPLANT
IRRIG SUCT STRYKERFLOW 2 WTIP (MISCELLANEOUS) ×2
IRRIGATION SUCT STRKRFLW 2 WTP (MISCELLANEOUS) ×1 IMPLANT
KIT BASIN OR (CUSTOM PROCEDURE TRAY) ×2 IMPLANT
KIT GASTRIC LAVAGE 34FR ADT (SET/KITS/TRAYS/PACK) ×2 IMPLANT
KIT TURNOVER KIT A (KITS) IMPLANT
LUBRICANT JELLY K Y 4OZ (MISCELLANEOUS) IMPLANT
MARKER SKIN DUAL TIP RULER LAB (MISCELLANEOUS) ×2 IMPLANT
MAT PREVALON FULL STRYKER (MISCELLANEOUS) ×2 IMPLANT
NDL SPNL 18GX3.5 QUINCKE PK (NEEDLE) ×1 IMPLANT
NEEDLE SPNL 18GX3.5 QUINCKE PK (NEEDLE) ×2 IMPLANT
OBTURATOR OPTICAL STANDARD 8MM (TROCAR) ×1
OBTURATOR OPTICAL STND 8 DVNC (TROCAR) ×1
OBTURATOR OPTICALSTD 8 DVNC (TROCAR) ×1 IMPLANT
PACK CARDIOVASCULAR III (CUSTOM PROCEDURE TRAY) ×2 IMPLANT
RELOAD STAPLE 60 2.5 WHT DVNC (STAPLE) ×3 IMPLANT
RELOAD STAPLE 60 3.5 BLU DVNC (STAPLE) IMPLANT
RELOAD STAPLER 2.5X60 WHT DVNC (STAPLE) ×7 IMPLANT
RELOAD STAPLER 3.5X60 BLU DVNC (STAPLE) ×1 IMPLANT
SEAL CANN UNIV 5-8 DVNC XI (MISCELLANEOUS) ×3 IMPLANT
SEAL XI 5MM-8MM UNIVERSAL (MISCELLANEOUS) ×3
SEALER SYNCHRO 8 IS4000 DV (MISCELLANEOUS) ×1
SEALER SYNCHRO 8 IS4000 DVNC (MISCELLANEOUS) ×1 IMPLANT
SOL ANTI FOG 6CC (MISCELLANEOUS) ×1 IMPLANT
SOLUTION ANTI FOG 6CC (MISCELLANEOUS) ×1
SOLUTION ELECTROLUBE (MISCELLANEOUS) ×2 IMPLANT
SPIKE FLUID TRANSFER (MISCELLANEOUS) ×2 IMPLANT
STAPLER 60 DA VINCI SURE FORM (STAPLE) ×1
STAPLER 60 SUREFORM DVNC (STAPLE) ×1 IMPLANT
STAPLER CANNULA SEAL DVNC XI (STAPLE) ×1 IMPLANT
STAPLER CANNULA SEAL XI (STAPLE) ×1
STAPLER RELOAD 2.5X60 WHITE (STAPLE) ×7
STAPLER RELOAD 2.5X60 WHT DVNC (STAPLE) ×7
STAPLER RELOAD 3.5X60 BLU DVNC (STAPLE) ×1
STAPLER RELOAD 3.5X60 BLUE (STAPLE) ×1
SUT ETHIBOND 0 (SUTURE) ×2 IMPLANT
SUT MNCRL AB 4-0 PS2 18 (SUTURE) ×2 IMPLANT
SUT V-LOC BARB 180 2/0GR6 GS22 (SUTURE) ×4
SUT VIC AB 2-0 SH 27 (SUTURE) ×1
SUT VIC AB 2-0 SH 27XBRD (SUTURE) IMPLANT
SUT VICRYL 0 TIES 12 18 (SUTURE) ×2 IMPLANT
SUT VLOC BARB 180 ABS3/0GR12 (SUTURE) ×4
SUTURE V-LC BRB 180 2/0GR6GS22 (SUTURE) ×2 IMPLANT
SUTURE VLOC BRB 180 ABS3/0GR12 (SUTURE) ×2 IMPLANT
SYR 20ML LL LF (SYRINGE) ×2 IMPLANT
TOWEL OR 17X26 10 PK STRL BLUE (TOWEL DISPOSABLE) ×2 IMPLANT
TRAY FOLEY MTR SLVR 16FR STAT (SET/KITS/TRAYS/PACK) IMPLANT
TROCAR ADV FIXATION 12X100MM (TROCAR) ×2 IMPLANT
TROCAR Z-THREAD FIOS 5X100MM (TROCAR) ×2 IMPLANT
TUBE CALIBRATION LAPBAND (TUBING) IMPLANT
TUBING INSUFFLATION 10FT LAP (TUBING) ×2 IMPLANT

## 2021-07-26 NOTE — Op Note (Signed)
Patient: Charlene Martinez (03-01-1976, [id number])  Date of Surgery: 07/26/2021   Preoperative Diagnosis: Morbid obesity, BMI 45.35  Postoperative Diagnosis: Morbid obesity, BMI 45.35  Surgical Procedure: XI ROBOT ASSISTED GASTRIC BYPASS ROUX-EN-Y WITH UPPER ENDO:    Operative Team Members:  Surgeon(s) and Role:    * Seanpatrick Maisano, Hyman Hopes, MD - Primary    * Berna Bue, MD - Assisting   [place], [place] Student [place]  Anesthesiologist: Collene Schlichter, MD CRNA: Ezekiel Ina, CRNA; Epimenio Sarin, CRNA   Anesthesia: General   Fluids:  Total I/O In: 900 [I.V.:800; IV Piggyback:100] Out: 25 [Blood:25]  Complications: None  Drains:  none   Specimen: None  Disposition:  PACU - hemodynamically stable.  Plan of Care: Admit to inpatient   Indications for Procedure:  JIMMI SIDENER is an 46 y.o. female seen for bariatric surgery consultation. We had a long discussion about current bariatric surgeries and the surgeries I offer: sleeve gastrectomy and gastric bypass. We discussed the procedures themselves as well as the risks, benefits, and alternatives. I used the [facility] bariatric surgery risk-benefit calculator to facilitate our discussion. After full discussion all questions instrumentation states she would like to pursue a robotic gastric bypass. She completed the preoperative pathway:   - Labwork - Dietary consult - Completed with Serena Colonel, RD - Chest x-ray - 08/18/20 negative - EKG - NSR 08/18/20 - Evaluation by primary care physician (hasn't seen a doctor in about 7 years) - Does not need a sleep study as she only scored a 2 on her stop bang questionnaire - Psychology evaluation - Upper endoscopy - Normal esophagus. Gastroesophageal flap valve classified as Hill Grade I (prominent fold, tight to endoscope). Appears to be a good candidate for gastric bypass.   Today she presents for robotic gastric bypass.  The risks, benefits and alternatives were discussed  again and the patient granted consent to proceed.  We will proceed as scheduled.    Findings: Hernia noted in right pelvis  Infection status: Patient: Private Patient Elective Case Case: Elective Infection Present At Time Of Surgery (PATOS): Some spillage of foregut  and jejunal contents while creating anastomoses   Description of Procedure:   On the date stated above, the patient was taken to the operating room suite and placed in supine positioning.  General endotracheal anesthesia was induced.  A timeout was completed verifying the correct patient, procedure, positioning and equipment needed for the case.  The patient's abdomen was prepped and draped in the usual sterile fashion.  A 5 mm trocar was used to enter the right upper quadrant using optical technique.  The abdomen was entered safely without any trauma the underlying viscera.  Three additional incisions were made and 4 robotic trochars were placed across the abdomen, replacing the 5 mm trocar with the 12 mm robotic stapler trocar. An assistant trocar was placed in the lower midline.   The [facility] liver retractor was placed through the subxiphoid region and under the left lobe of the liver and was connected to the rail of the bed.  A TAP block was placed using marcaine and Exparel under direct vision of the laparoscope.  The [employer] XI robotic platform was docked and we transitioned to robotic surgery.  Using the tip up grasper, fenestrated bipolar, 30 degree camera and Synchroseal from the patient's right to left, we began by dissecting the angle of His off the left crus of the diaphragm.  The adhesions between the stomach,  spleen and diaphragm were divided using the Synchroseal to define the angle of His.  I then started 4-6 cm down on the lesser curve of the stomach and created a defect in the gastrohepatic ligament tracking behind the lesser curve of the stomach to enter the lesser sac.  I then used multiple white loads of the  robotic 60 mm Sureform linear stapler to form the gastric pouch.  I then directed my attention to the lower abdomen.  The omentum was divided with the Synchroseal and I identified the ligament of treitz.  The jejunum was run to a point 50 cm from the ligament of Treitz.  This loop of bowel was then brought into the left upper quadrant, over the transverse colon, between the split omentum.  A 2-0 v-loc suture was used to create the posterior outer row of the gastrojejunal anastomosis.  An approximately 2 cm gastrotomy was made in the pouch and a matching 2 cm enterotomy was created in the roux limb.  Then, two 3-0 v-loc sutures were used to create a posterior, inner, full thickness layer of the anastomosis.  While sewing the anterior inner layer, the Ewald tube was passed through the anastomosis to ensure patency.  The outer, anterior layer was then created using 2-0 v-loc suture.  A window was made in the jejunal mesentery and the jejunum was divided just proximal to the gastrojejunal anastomosis using a white load of the robotic 60 mm Sureform linear stapler to divide the roux limb from the hepatobiliary limb.  At this point the gastrojejunal anastomosis was complete and the Ewald tube was removed.  The roux limb was then run 100 cm from the gastrojejunal anastomosis.  This loop of bowel was brought into the left upper quadrant for anastomosis to the hepatobiliary limb.  A robotic 60 mm white load on the Sureform linear stapler was introduced into both limbs and fired to create the jejunojejunostomy.  A second 60 mm white load of the Sureform linear stapler was fired to connect the distal Roux limb to the proximal common channel creating a W shaped anastomosis.  The common enterotomy was closed with a final 60 mm white load of the Sureform linear stapler.  The jejunojejunostomy mesenteric defect was closed using running 0-Ethibond suture.   The retro-roux defect was closed, closing the roux limb mesentery to the  transverse mesocolon mesentery using a running 0-Ethibond suture.   I ran the roux limb from the gastrojejunal anastomosis to the jejunojejunostomy and found the anatomy as expected without any twisting of the mesentery.  I then transitioned to endoscopic portion of the procedure.  The adult upper endoscope was inserted into the pouch.  The pouch appeared appropriately sized.  There was good intra-luminal hemostasis.  The endoscope was inserted through the anastomosis into the roux limb.  The anastomosis appeared well formed without any stricture.  The anastomosis was submerged in irrigation in the left upper quadrant and there was no leakage of air bubbles with endoscopic insufflation suggesting a negative leak test and an air tight anastomosis.    The foregut was decompressed with the endoscope and the endoscope was removed.  The robotic instruments were removed and the robot was undocked.  The stapler port in the right abdomen was closed at the fascial level using 0-vicryl on a PMI suture passer.  The liver retractor was removed under direct vision.  The pneumoperitoneum was evacuated.  The skin was closed using 4-0 Monocryl and Dermabond.  All sponge and needle  counts were correct at the end of the case.    Ivar Drape, MD General, Bariatric, & Minimally Invasive Surgery [facility] Surgery, [place]

## 2021-07-26 NOTE — Anesthesia Procedure Notes (Signed)
Procedure Name: Intubation Date/Time: 07/26/2021 11:53 AM Performed by: Montel Clock, CRNA Pre-anesthesia Checklist: Patient identified, Emergency Drugs available, Suction available, Patient being monitored and Timeout performed Patient Re-evaluated:Patient Re-evaluated prior to induction Oxygen Delivery Method: Circle system utilized Preoxygenation: Pre-oxygenation with 100% oxygen Induction Type: IV induction Ventilation: Mask ventilation without difficulty and Oral airway inserted - appropriate to patient size Laryngoscope Size: Mac and 3 Grade View: Grade I Tube type: Oral Tube size: 7.0 mm Number of attempts: 1 Airway Equipment and Method: Stylet Placement Confirmation: ETT inserted through vocal cords under direct vision, positive ETCO2 and breath sounds checked- equal and bilateral Secured at: 22 cm Tube secured with: Tape Dental Injury: Teeth and Oropharynx as per pre-operative assessment

## 2021-07-26 NOTE — Progress Notes (Signed)
PHARMACY CONSULT FOR:  Risk Assessment for Post-Discharge VTE Following Bariatric Surgery  Post-Discharge VTE Risk Assessment: This patient's probability of 30-day post-discharge VTE is increased due to the factors marked: X Sleeve gastrectomy   Liver disorder (transplant, cirrhosis, or nonalcoholic steatohepatitis)   Hx of VTE   Hemorrhage requiring transfusion   GI perforation, leak, or obstruction   ====================================================    Female    Age >/=60 years    BMI >/=50 kg/m2    CHF    Dyspnea at Rest    Paraplegia   X Non-gastric-band surgery    Operation Time >/=3 hr    Return to OR     Length of Stay >/= 3 d   Hypercoagulable condition   Significant venous stasis    Predicted probability of 30-day post-discharge VTE: 0.16% (mild)  Other patient-specific factors to consider:   Recommendation for Discharge: No pharmacologic prophylaxis post-discharge  Charlene Martinez is a 46 y.o. female who underwent Roux-en-y gastric bypass w/ upper endoscopy on 07/26/21   Case start: 1209 Case end: 1429   Allergies  Allergen Reactions   Sulfa Antibiotics Anaphylaxis    Patient Measurements: Height: 5\' 5"  (165.1 cm) Weight: 123.6 kg (272 lb 8 oz) IBW/kg (Calculated) : 57 Body mass index is 45.35 kg/m.  No results for input(s): WBC, HGB, HCT, PLT, APTT, CREATININE, LABCREA, CREATININE, CREAT24HRUR, MG, PHOS, ALBUMIN, PROT, ALBUMIN, AST, ALT, ALKPHOS, BILITOT, BILIDIR, IBILI in the last 72 hours. Estimated Creatinine Clearance: 117.2 mL/min (by C-G formula based on SCr of 0.57 mg/dL).    Past Medical History:  Diagnosis Date   Hypertension      Medications Prior to Admission  Medication Sig Dispense Refill Last Dose   DENTA 5000 PLUS 1.1 % CREA dental cream Take 1 application by mouth at bedtime.   07/25/2021   levonorgestrel (MIRENA) 20 MCG/DAY IUD 1 Intra Uterine Device by Intrauterine route See admin instructions. Every 5 years   in place    losartan (COZAAR) 25 MG tablet Take 25 mg by mouth daily.   07/25/2021   Multiple Vitamin (MULTIVITAMIN WITH MINERALS) TABS tablet Take 1 tablet by mouth daily.   Past Week   Vitamin D, Ergocalciferol, (DRISDOL) 1.25 MG (50000 UNIT) CAPS capsule Take 50,000 Units by mouth once a week.   Past Week   acetaminophen (TYLENOL) 500 MG tablet Take 1,000 mg by mouth every 6 (six) hours as needed for moderate pain.   More than a month    09/22/2021, PharmD 07/26/2021 4:55 PM

## 2021-07-26 NOTE — Plan of Care (Signed)
Discussed with patient and husband about plan of care for post-op day 0. ° ° °Will continue to monitor patient.  ° ° °SWhittemore, RN ° °

## 2021-07-26 NOTE — Anesthesia Postprocedure Evaluation (Signed)
Anesthesia Post Note  Patient: Charlene Martinez  Procedure(s) Performed: XI ROBOT ASSISTED GASTRIC BYPASS ROUX-EN-Y WITH UPPER ENDO     Patient location during evaluation: PACU Anesthesia Type: General Level of consciousness: awake and alert Pain management: pain level controlled Vital Signs Assessment: post-procedure vital signs reviewed and stable Respiratory status: spontaneous breathing, nonlabored ventilation, respiratory function stable and patient connected to nasal cannula oxygen Cardiovascular status: blood pressure returned to baseline and stable Postop Assessment: no apparent nausea or vomiting Anesthetic complications: no   No notable events documented.  Last Vitals:  Vitals:   07/26/21 0944 07/26/21 1442  BP: (!) 154/99 (!) 155/76  Pulse: 96 95  Resp: 18 (!) 21  Temp: 36.8 C 37.5 C  SpO2: 99% 100%    Last Pain:  Vitals:   07/26/21 1515  TempSrc:   PainSc: [place]

## 2021-07-26 NOTE — Transfer of Care (Signed)
Immediate Anesthesia Transfer of Care Note  Patient: Charlene Martinez  Procedure(s) Performed: Desma Paganini ASSISTED GASTRIC BYPASS ROUX-EN-Y WITH UPPER ENDO  Patient Location: PACU  Anesthesia Type:General  Level of Consciousness: drowsy and patient cooperative  Airway & Oxygen Therapy: Patient Spontanous Breathing and Patient connected to face mask oxygen  Post-op Assessment: Report given to RN and Post -op Vital signs reviewed and stable  Post vital signs: Reviewed and stable  Last Vitals:  Vitals Value Taken Time  BP 155/76 07/26/21 1442  Temp 37.5 C 07/26/21 1442  Pulse 94 07/26/21 1445  Resp 24 07/26/21 1445  SpO2 95 % 07/26/21 1445  Vitals shown include unvalidated device data.  Last Pain:  Vitals:   07/26/21 0944  TempSrc: Oral  PainSc:          Complications: No notable events documented.

## 2021-07-26 NOTE — Anesthesia Preprocedure Evaluation (Addendum)
Anesthesia Evaluation  Patient identified by MRN, date of birth, ID band Patient awake    Reviewed: Allergy & Precautions, NPO status , Patient's Chart, lab work & pertinent test results  Airway Mallampati: I  TM Distance: >3 FB Neck ROM: Full    Dental  (+) Teeth Intact, Dental Advisory Given   Pulmonary former smoker,    Pulmonary exam normal breath sounds clear to auscultation       Cardiovascular hypertension, Pt. on medications  Rhythm:Regular Rate:Tachycardia     Neuro/Psych negative neurological ROS  negative psych ROS   GI/Hepatic negative GI ROS, Neg liver ROS,   Endo/Other  Morbid obesity  Renal/GU negative Renal ROS     Musculoskeletal negative musculoskeletal ROS (+)   Abdominal   Peds  Hematology negative hematology ROS (+)   Anesthesia Other Findings Day of surgery medications reviewed with the patient.  Reproductive/Obstetrics negative OB ROS                            Anesthesia Physical Anesthesia Plan  ASA: 3  Anesthesia Plan: General   Post-op Pain Management: Tylenol PO (pre-op)   Induction: Intravenous  PONV Risk Score and Plan: 4 or greater and Scopolamine patch - Pre-op, Midazolam, Dexamethasone and Ondansetron  Airway Management Planned: Oral ETT  Additional Equipment:   Intra-op Plan:   Post-operative Plan: Extubation in OR  Informed Consent: I have reviewed the patients History and Physical, chart, labs and discussed the procedure including the risks, benefits and alternatives for the proposed anesthesia with the patient or authorized representative who has indicated his/her understanding and acceptance.     Dental advisory given  Plan Discussed with: CRNA  Anesthesia Plan Comments:         Anesthesia Quick Evaluation

## 2021-07-26 NOTE — H&P (Signed)
Admitting Physician: [place]  Service: Bariatric surgery  CC: Morbid obesity  Subjective   HPI: NIKISHIA MCMORRIS is an 46 y.o. female who is here for robotic roux-en-y gastric bypass.  Past Medical History:  Diagnosis Date   Hypertension     Past Surgical History:  Procedure Laterality Date   BIOPSY  05/18/2021   Procedure: BIOPSY;  Surgeon: Felicie Morn, MD;  Location: [facility];  Service: General;;   CESAREAN SECTION  07/2012   3 total-2012,2006   ESOPHAGOGASTRODUODENOSCOPY N/A 05/18/2021   Procedure: ESOPHAGOGASTRODUODENOSCOPY (EGD);  Surgeon: Felicie Morn, MD;  Location: [facility];  Service: General;  Laterality: N/A;   TUBAL LIGATION  2014    No family history on file.  Social:  reports that she quit smoking about 18 years ago. Her smoking use included cigarettes. She has a 1.50 pack-year smoking history. She does not have any smokeless tobacco history on file. She reports current alcohol use. She reports that she does not use drugs.  Allergies:  Allergies  Allergen Reactions   Sulfa Antibiotics Anaphylaxis    Medications: Current Outpatient Medications  Medication Instructions   acetaminophen (TYLENOL) 1,000 mg, Oral, Every 6 hours PRN   DENTA 5000 PLUS 1.1 % CREA dental cream 1 application, Oral, Daily at bedtime   levonorgestrel (MIRENA) 20 MCG/DAY IUD 1 Intra Uterine Device, Intrauterine, See admin instructions, Every 5 years   losartan (COZAAR) 25 mg, Oral, Daily   Multiple Vitamin (MULTIVITAMIN WITH MINERALS) TABS tablet 1 tablet, Oral, Daily   Vitamin D (Ergocalciferol) (DRISDOL) 50,000 Units, Oral, Weekly    ROS - all of the below systems have been reviewed with the patient and positives are indicated with bold text General: chills, fever or night sweats Eyes: blurry vision or double vision ENT: epistaxis or sore throat Allergy/Immunology: itchy/watery eyes or nasal congestion Hematologic/Lymphatic: bleeding  problems, blood clots or swollen lymph nodes Endocrine: temperature intolerance or unexpected weight changes Breast: new or changing breast lumps or nipple discharge Resp: cough, shortness of breath, or wheezing CV: chest pain or dyspnea on exertion GI: as per HPI GU: dysuria, trouble voiding, or hematuria MSK: joint pain or joint stiffness Neuro: TIA or stroke symptoms Derm: pruritus and skin lesion changes Psych: anxiety and depression  Objective   PE There were no vitals taken for this visit. Constitutional: NAD; conversant; no deformities Eyes: Moist conjunctiva; no lid lag; anicteric; PERRL Neck: Trachea midline; no thyromegaly Lungs: Normal respiratory effort; no tactile fremitus CV: RRR; no palpable thrills; no pitting edema GI: Abd Soft, non-tender; no palpable hepatosplenomegaly MSK: Normal range of motion of extremities; no clubbing/cyanosis Psychiatric: Appropriate affect; alert and oriented x3 Lymphatic: No palpable cervical or axillary lymphadenopathy  No results found for this or any previous visit (from the past 24 hour(s)).  Imaging Orders  No imaging studies ordered today     Assessment and Plan   IRESHA LEVISON is an 46 y.o. female seen for bariatric surgery consultation. We had a long discussion about current bariatric surgeries and the surgeries I offer: sleeve gastrectomy and gastric bypass. We discussed the procedures themselves as well as the risks, benefits, and alternatives. I used the [facility] bariatric surgery risk-benefit calculator to facilitate our discussion. After full discussion all questions instrumentation states she would like to pursue a robotic gastric bypass. She completed the preoperative pathway:  - Labwork - Dietary consult - Completed with Sandie Ano, RD - Chest x-ray - 08/18/20 negative - EKG - NSR 08/18/20 -  Evaluation by primary care physician (hasn't seen a doctor in about 7 years) - Does not need a sleep study as she only  scored a 2 on her stop bang questionnaire - Psychology evaluation - Upper endoscopy - Normal esophagus. Gastroesophageal flap valve classified as Hill Grade I (prominent fold, tight to endoscope). Appears to be a good candidate for gastric bypass.  Today she presents for robotic gastric bypass.  The risks, benefits and alternatives were discussed again and the patient granted consent to proceed.  We will proceed as scheduled.    Felicie Morn, MD  [facility] Use [email or web address] to contact on call provider

## 2021-07-27 LAB — CBC WITH DIFFERENTIAL/PLATELET
Abs Immature Granulocytes: 0.05 10*3/uL (ref 0.00–0.07)
Basophils Absolute: 0 10*3/uL (ref 0.0–0.1)
Basophils Relative: 0 %
Eosinophils Absolute: 0 10*3/uL (ref 0.0–0.5)
Eosinophils Relative: 0 %
HCT: 37.2 % (ref 36.0–46.0)
Hemoglobin: 12.4 g/dL (ref 12.0–15.0)
Immature Granulocytes: 0 %
Lymphocytes Relative: 10 %
Lymphs Abs: 1.2 10*3/uL (ref 0.7–4.0)
MCH: 29 pg (ref 26.0–34.0)
MCHC: 33.3 g/dL (ref 30.0–36.0)
MCV: 86.9 fL (ref 80.0–100.0)
Monocytes Absolute: 0.9 10*3/uL (ref 0.1–1.0)
Monocytes Relative: 7 %
Neutro Abs: 10.7 10*3/uL — ABNORMAL HIGH (ref 1.7–7.7)
Neutrophils Relative %: 83 %
Platelets: 216 10*3/uL (ref 150–400)
RBC: 4.28 MIL/uL (ref 3.87–5.11)
RDW: 12.9 % (ref 11.5–15.5)
WBC: 12.8 10*3/uL — ABNORMAL HIGH (ref 4.0–10.5)
nRBC: 0 % (ref 0.0–0.2)

## 2021-07-27 MED ORDER — ACETAMINOPHEN 500 MG PO TABS: 1000.0000 mg | ORAL_TABLET | Freq: Three times a day (TID) | ORAL | 0 refills | Status: AC

## 2021-07-27 MED ORDER — ONDANSETRON 4 MG PO TBDP: 4.0000 mg | ORAL_TABLET | Freq: Four times a day (QID) | ORAL | 0 refills | Status: AC | PRN

## 2021-07-27 MED ORDER — OXYCODONE HCL 5 MG PO TABS: 5.0000 mg | ORAL_TABLET | Freq: Four times a day (QID) | ORAL | 0 refills | Status: AC | PRN

## 2021-07-27 MED ORDER — PANTOPRAZOLE SODIUM 40 MG PO TBEC: 40.0000 mg | DELAYED_RELEASE_TABLET | Freq: Every day | ORAL | 0 refills | Status: AC

## 2021-07-27 NOTE — Progress Notes (Signed)
Patient alert and oriented, pain is controlled. Patient is tolerating fluids, advanced to protein shake today, patient is tolerating well. Reviewed Gastric Bypass discharge instructions with patient and patient is able to articulate understanding. Provided information on BELT program, Support Group and [facility] outpatient pharmacy. All questions answered, will continue to monitor.    

## 2021-07-27 NOTE — Progress Notes (Signed)
Discussed [facility] "Goals for Discharge" document with patient including ambulation in halls, Incentive Spirometry use every hour, and oral care.  Also discussed pain and nausea control.  Enabled or verified head of bed 30 degree alarm activated.  Questions answered.  Will continue to partner with bedside RN and follow up with patient per protocol.  °

## 2021-07-27 NOTE — Plan of Care (Signed)
Pt ready to DC home with husband 

## 2021-07-27 NOTE — Progress Notes (Signed)
°  Transition of Care [facility]) Screening Note   Patient Details  Name: NASEEM ADLER Date of Birth: [birth year]   Transition of [facility]) CM/SW Contact:    Amada Jupiter, LCSW Phone Number: 07/27/2021, 9:44 AM    Transition of Care Department [facility]) has reviewed patient and no TOC needs have been identified at this time. We will continue to monitor patient advancement through interdisciplinary progression rounds. If new patient transition needs arise, please place a TOC consult.

## 2021-07-27 NOTE — Discharge Summary (Signed)
°  Patient ID: Charlene Martinez [id number] 46 y.o. 1976/05/21  07/26/2021  Discharge date and time: 07/27/2021  Admitting Physician: Charlene Martinez  Discharge Physician: Charlene Martinez  Admission Diagnoses: Gastroesophageal reflux disease [K21.9] Patient Active Problem List   Diagnosis Date Noted   Gastroesophageal reflux disease 07/26/2021     Discharge Diagnoses: Morbid obesity Patient Active Problem List   Diagnosis Date Noted   Gastroesophageal reflux disease 07/26/2021    Operations: Procedure(s): XI ROBOT ASSISTED GASTRIC BYPASS ROUX-EN-Y WITH UPPER ENDO  Admission Condition: good  Discharged Condition: good  Indication for Admission: Morbid obesity  Hospital Course: Charlene Martinez underwent robotic gastric bypass on 07/27/21. She recovered well and was discharged.  Consults: None  Significant Diagnostic Studies: none  Treatments: surgery: As above  Disposition: Home  Patient Instructions:  Allergies as of 07/27/2021       Reactions   Sulfa Antibiotics Anaphylaxis        Medication List     TAKE these medications    acetaminophen 500 MG tablet Commonly known as: TYLENOL Take 1,000 mg by mouth every 6 (six) hours as needed for moderate pain. What changed: Another medication with the same name was added. Make sure you understand how and when to take each.   acetaminophen 500 MG tablet Commonly known as: TYLENOL Take 2 tablets (1,000 mg total) by mouth every 8 (eight) hours for 5 days. What changed: You were already taking a medication with the same name, and this prescription was added. Make sure you understand how and when to take each.   Denta 5000 Plus 1.1 % Crea dental cream Generic drug: sodium fluoride Take 1 application by mouth at bedtime.   levonorgestrel 20 MCG/DAY Iud Commonly known as: MIRENA 1 Intra Uterine Device by Intrauterine route See admin instructions. Every 5 years   losartan 25 MG tablet Commonly known as:  COZAAR Take 25 mg by mouth daily.   multivitamin with minerals Tabs tablet Take 1 tablet by mouth daily.   ondansetron 4 MG disintegrating tablet Commonly known as: ZOFRAN-ODT Take 1 tablet (4 mg total) by mouth every 6 (six) hours as needed for nausea or vomiting.   oxyCODONE 5 MG immediate release tablet Commonly known as: Oxy IR/ROXICODONE Take 1 tablet (5 mg total) by mouth every 6 (six) hours as needed for severe pain.   pantoprazole 40 MG tablet Commonly known as: PROTONIX Take 1 tablet (40 mg total) by mouth daily.   Vitamin D (Ergocalciferol) 1.25 MG (50000 UNIT) Caps capsule Commonly known as: DRISDOL Take 50,000 Units by mouth once a week.        Activity: no heavy lifting for 4 weeks Diet:  bariatric diet protocol Wound Care: keep wound clean and dry  Follow-up:  With Dr. Dossie Der per bariatric follow up protocol  Signed: Hyman Hopes Paislie Martinez [facility], Bariatric, [facility] Minimally Invasive [facility]   07/27/2021, 7:27 AM

## 2021-07-27 NOTE — Discharge Instructions (Addendum)

## 2021-07-28 NOTE — Progress Notes (Signed)
24hr fluid recall prior to discharge: 600mL.  Per dehydration protocol, will call pt to f/u within one week post op. °

## 2021-07-30 ENCOUNTER — Telehealth (HOSPITAL_COMMUNITY): Payer: Self-pay | Admitting: *Deleted

## 2021-07-30 NOTE — Telephone Encounter (Signed)
1.  Tell me about your pain and pain management? Pt c/o right lower abdominal incisional pain with movement and exertion.  Pt states that s/he has been taking Tylenol. Discussed with patient to try and splint her abdomen with changing positions to assist with the discomfort.  Encouraged pt to try options and/or contact [facility] if still concerned.   2.  Let's talk about fluid intake.  How much total fluid are you taking in? Pt states that she is working to meet goal of 64 oz of fluid today.  Pt has been able to consume approx. 50-52oz of fluid per day since surgery.  Pt plans to increase clear liquids to meet fluid goals.  Pt instructed to assess status and suggestions daily utilizing Hydration Action Plan on discharge folder and to call [facility] if in the "red zone".   3.  How much protein have you taken in the last 2 days? Pt states she is meeting her goal of 60g of protein each day with the protein shakes.  4.  Have you had nausea?  Tell me about when have experienced nausea and what you did to help? Pt denies nausea.   5.  Has the frequency or color changed with your urine? Pt states that she is urinating "fine" with no changes in frequency or urgency.     6.  Tell me what your incisions look like? "Incisions look fine". Pt denies a fever, chills.  Pt states incisions are not swollen, open, or draining.  Pt encouraged to call [facility] if incisions change.   7.  Have you been passing gas? BM? Pt states that she is having BMs. Last BM 07/30/21.     8.  If a problem or question were to arise who would you call?  Do you know contact numbers for [facility], [facility], and [facility]? Pt denies dehydration symptoms.  Pt can describe s/sx of dehydration.  Pt knows to call [facility] for surgical, [facility] for nutrition, and [facility] for non-urgent questions or concerns.   9.  How has the walking going? Pt states she is walking around and able to be active without difficulty.   10. Are you still using your incentive spirometer?  If so, how  often? Pt states that she is doing the I.S. 4-5x/hr reaching goal of 2250. Pt encouraged to use incentive spirometer, at least 10x every hour while awake until she sees the surgeon.  11.  How are your vitamins and calcium going?  How are you taking them? Pt states that she is taking her supplements and vitamins without difficulty.  Reminded patient that the first 30 days post-operatively are important for successful recovery.  Practice good hand hygiene, wearing a mask when appropriate (since optional in most places), and minimizing exposure to people who live outside of the home, especially if they are exhibiting any respiratory, GI, or illness-like symptoms.

## 2021-08-10 ENCOUNTER — Encounter: Payer: 59 | Attending: Surgery | Admitting: Skilled Nursing Facility1

## 2021-08-10 ENCOUNTER — Other Ambulatory Visit: Payer: Self-pay

## 2021-08-10 DIAGNOSIS — E669 Obesity, unspecified: Secondary | ICD-10-CM | POA: Insufficient documentation

## 2021-08-11 NOTE — Progress Notes (Signed)
2 Week Post-Operative Nutrition Class   Patient was seen on 08/10/2021 for Post-Operative Nutrition education at the [facility].    Surgery date: 07/26/2021 Surgery type: RYGB Start weight at [facility]: 279.5 Weight today: 257.8 Bowel Habits: Every day to every other day no complaints   Body Composition Scale 08/11/2021  Current Body Weight 257.8  Total Body Fat % 44.9  Visceral Fat 15  Fat-Free Mass % 55   Total Body Water % 42  Muscle-Mass lbs 33.3  BMI 41.5  Body Fat Displacement          Torso  lbs 71.8         Left Leg  lbs 14.3         Right Leg  lbs 14.3         Left Arm  lbs 7.1         Right Arm   lbs 7.1      The following the learning objectives were met by the patient during this course: Identifies Phase 3 (Soft, High Proteins) Dietary Goals and will begin from 2 weeks post-operatively to 2 months post-operatively Identifies appropriate sources of fluids and proteins  Identifies appropriate fat sources and healthy verses unhealthy fat types   States protein recommendations and appropriate sources post-operatively Identifies the need for appropriate texture modifications, mastication, and bite sizes when consuming solids Identifies appropriate fat consumption and sources Identifies appropriate multivitamin and calcium sources post-operatively Describes the need for physical activity post-operatively and will follow MD recommendations States when to call healthcare provider regarding medication questions or post-operative complications   Handouts given during class include: Phase 3A: Soft, High Protein Diet Handout Phase 3 High Protein Meals Healthy Fats   Follow-Up Plan: Patient will follow-up at [facility] in 6 weeks for 2 month post-op nutrition visit for diet advancement per MD.

## 2021-08-16 ENCOUNTER — Telehealth: Payer: Self-pay | Admitting: Skilled Nursing Facility1

## 2021-08-16 NOTE — Telephone Encounter (Signed)
RD called pt to verify fluid intake once starting soft, solid proteins 2 week post-bariatric surgery.   Daily Fluid intake:  Daily Protein intake: Bowel Habits:   Concerns/issues:    LVM 

## 2021-08-25 ENCOUNTER — Ambulatory Visit: Payer: 59 | Admitting: Skilled Nursing Facility1

## 2021-09-27 ENCOUNTER — Ambulatory Visit: Payer: 59 | Admitting: Skilled Nursing Facility1

## 2021-10-07 ENCOUNTER — Encounter: Payer: 59 | Attending: Surgery | Admitting: Skilled Nursing Facility1

## 2021-10-07 DIAGNOSIS — Z713 Dietary counseling and surveillance: Secondary | ICD-10-CM | POA: Insufficient documentation

## 2021-10-07 DIAGNOSIS — Z6841 Body Mass Index (BMI) 40.0 and over, adult: Secondary | ICD-10-CM | POA: Diagnosis not present

## 2021-10-07 DIAGNOSIS — E669 Obesity, unspecified: Secondary | ICD-10-CM | POA: Insufficient documentation

## 2021-10-07 NOTE — Progress Notes (Signed)
Bariatric Nutrition Follow-Up Visit ?Medical Nutrition Therapy  ? ?NUTRITION ASSESSMENT ?  ? ?Surgery date: 07/26/2021 ?Surgery type: RYGB ?Start weight at [facility]: 279.5 ?Weight today: 230.5 pounds ?  ?Body Composition Scale 08/11/2021 10/07/2021  ?Current Body Weight 257.8 230.5  ?Total Body Fat % 44.9 42  ?Visceral Fat 15   ?Fat-Free Mass % 55   ? Total Body Water % 42 43.4  ?Muscle-Mass lbs 33.3   ?BMI 41.5   ?Body Fat Displacement    ?       Torso  lbs 71.8   ?       Left Leg  lbs 14.3   ?       Right Leg  lbs 14.3   ?       Left Arm  lbs 7.1   ?       Right Arm   lbs 7.1   ? ?Clinical  ?Medical hx: N/A ?Medications: see list ?Labs: A1C 5.6, HDL 37.4, vitamin D 28 ?Notable signs/symptoms: some back aches  ?Any previous deficiencies? No ?  ?Lifestyle & Dietary Hx ? ?Pt states she had a stomach bug but is better now.   ? ?Estimated daily fluid intake: 70 oz ?Estimated daily protein intake: 70+ g ?Supplements: multi and calcium  ?Current average weekly physical activity: 3 miles a day 4 days a week 60 minutes, yoga 3 days a week  ? ?24-Hr Dietary Recall ?First Meal: protein shake ?Snack:  yogurt or scrambled egg ?Second Meal: boiled egg + cheese ?Snack:   ?Third Meal: meatloaf ?Snack: half protein shake ?Beverages: water ? ?Post-Op Goals/ Signs/ Symptoms ?Using straws: no ?Drinking while eating: no ?Chewing/swallowing difficulties: no ?Changes in vision: no ?Changes to mood/headaches: no ?Hair loss/changes to skin/nails: no ?Difficulty focusing/concentrating: no ?Sweating: no ?Limb weakness: no ?Dizziness/lightheadedness: no ?Palpitations: no  ?Carbonated/caffeinated beverages: no ?N/V/D/C/Gas: no ?Abdominal pain: no ?Dumping syndrome: no ? ?  ?NUTRITION DIAGNOSIS  ?Overweight/obesity (Terminous-3.3) related to past poor dietary habits and physical inactivity as evidenced by completed bariatric surgery and following dietary guidelines for continued weight loss and healthy nutrition status. ?  ?  ?NUTRITION  INTERVENTION ?Nutrition counseling (C-1) and education (E-2) to facilitate bariatric surgery goals, including: ?Diet advancement to the next phase (phase 4) now including non starchy vegetables ?The importance of consuming adequate calories as well as certain nutrients daily due to the body's need for essential vitamins, minerals, and fats ?The importance of daily physical activity and to reach a goal of at least 150 minutes of moderate to vigorous physical activity weekly (or as directed by their physician) due to benefits such as increased musculature and improved lab values ?The importance of intuitive eating specifically learning hunger-satiety cues and understanding the importance of learning a new body: The importance of mindful eating to avoid grazing behaviors  ? ?Goals: ?-Continue to aim for a minimum of 64 fluid ounces 7 days a week with at least 30 ounces being plain water ? ?-Eat non-starchy vegetables 2 times a day 7 days a week ? ?-Start out with soft cooked vegetables today and tomorrow; if tolerated begin to eat raw vegetables or cooked including salads ? ?-Eat your 3 ounces of protein first then start in on your non-starchy vegetables; once you understand how much of your meal leads to satisfaction and not full while still eating 3 ounces of protein and non-starchy vegetables you can eat them in any order  ? ?-Continue to aim for 30 minutes of activity at least 5 times a  week ? ?-Do NOT cook with/add to your food: alfredo sauce, cheese sauce, barbeque sauce, ketchup, fat back, butter, bacon grease, grease, Crisco, OR SUGAR ? ? ?Handouts Provided Include  ?Phase 4 ? ?Learning Style & Readiness for Change ?Teaching method utilized: Visual & Auditory  ?Demonstrated degree of understanding via: Teach Back  ?Readiness Level: action ?Barriers to learning/adherence to lifestyle change: none identified  ? ?RD's Notes for Next Visit ?Assess adherence to pt chosen goals  ? ? ?MONITORING & EVALUATION ?Dietary  intake, weekly physical activity, body weight ? ?Next Steps ?Patient is to follow-up in July ?

## 2021-10-12 IMAGING — DX DG CHEST 2V
2 series · 2 of 2 positions shown · non-contrast
Comparison: No priors.

CLINICAL DATA: 44-year-old female with history of morbid obesity.
Preoperative study prior to potential bariatric surgery.

EXAM:
CHEST - 2 VIEW

[chest pa]
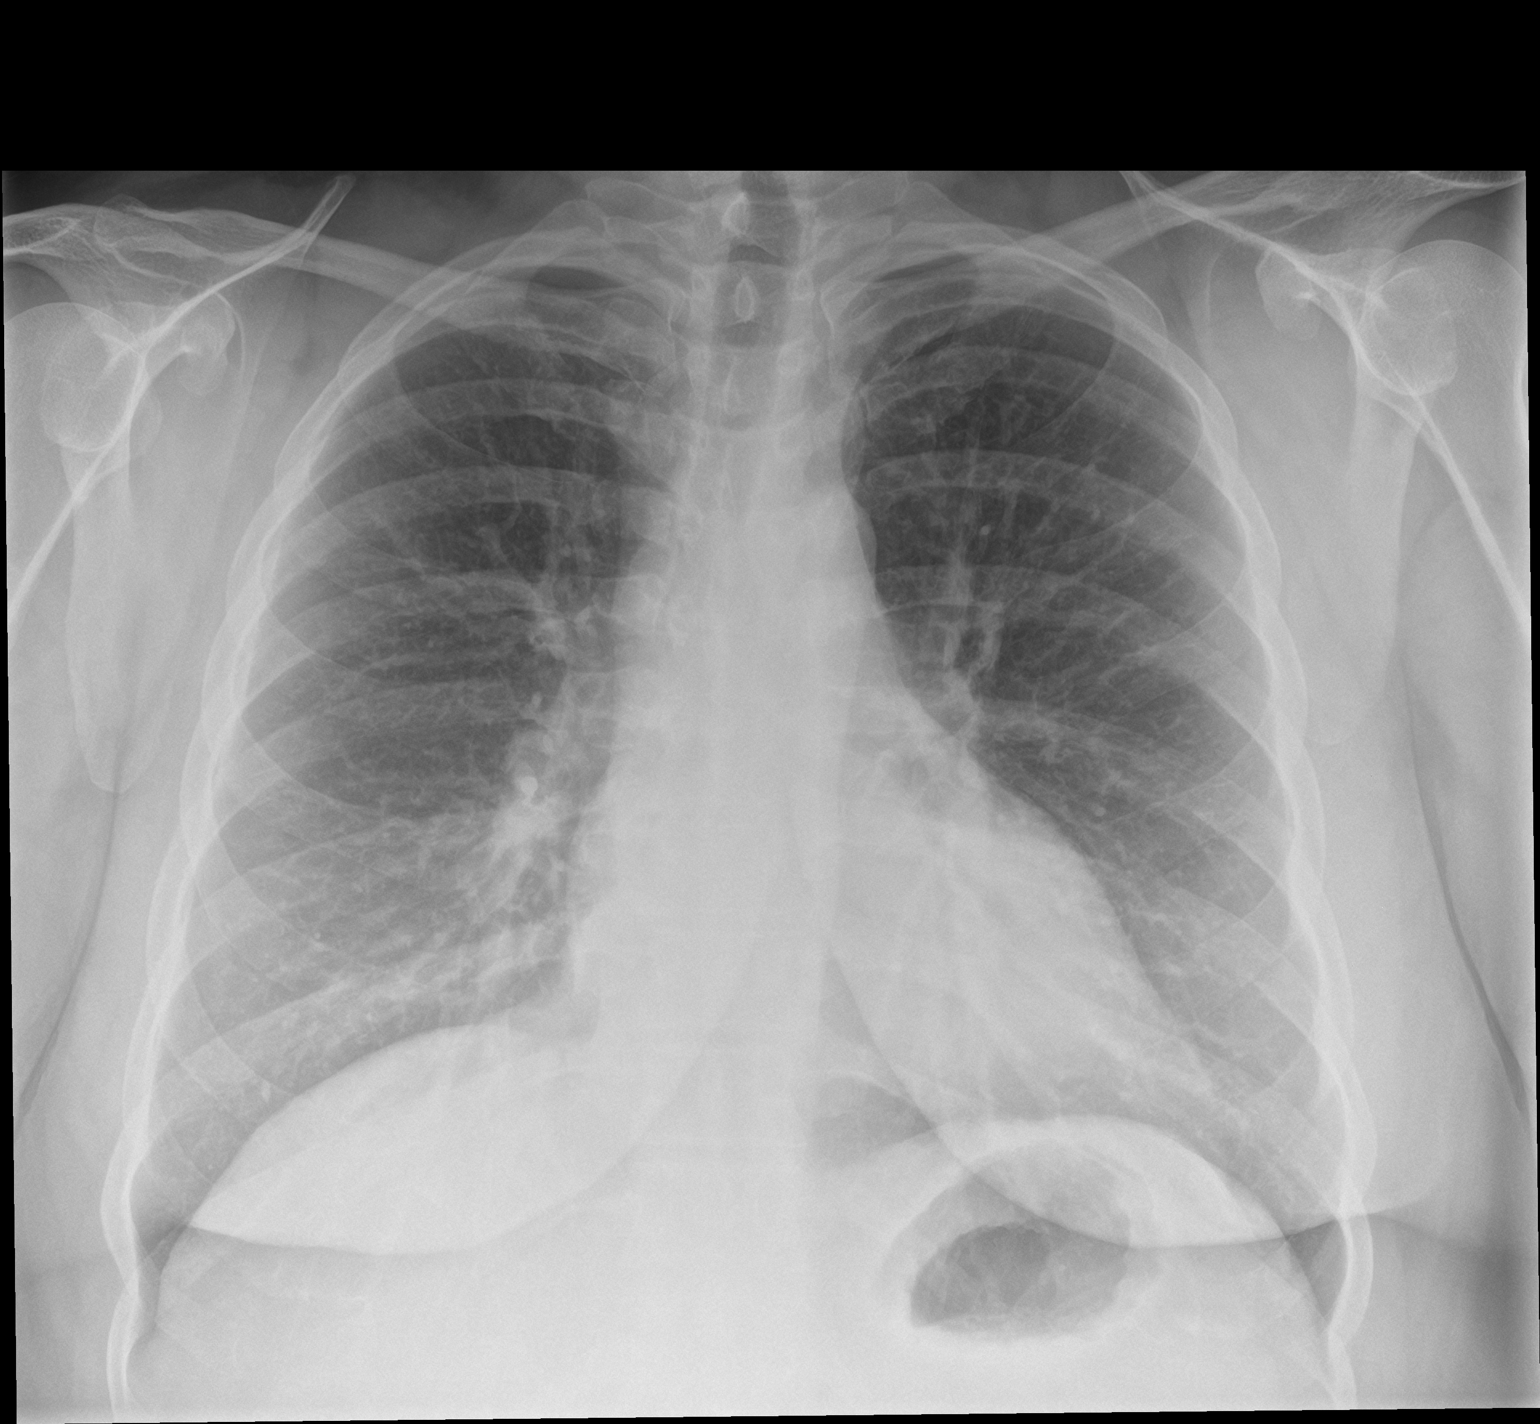

[chest lat]
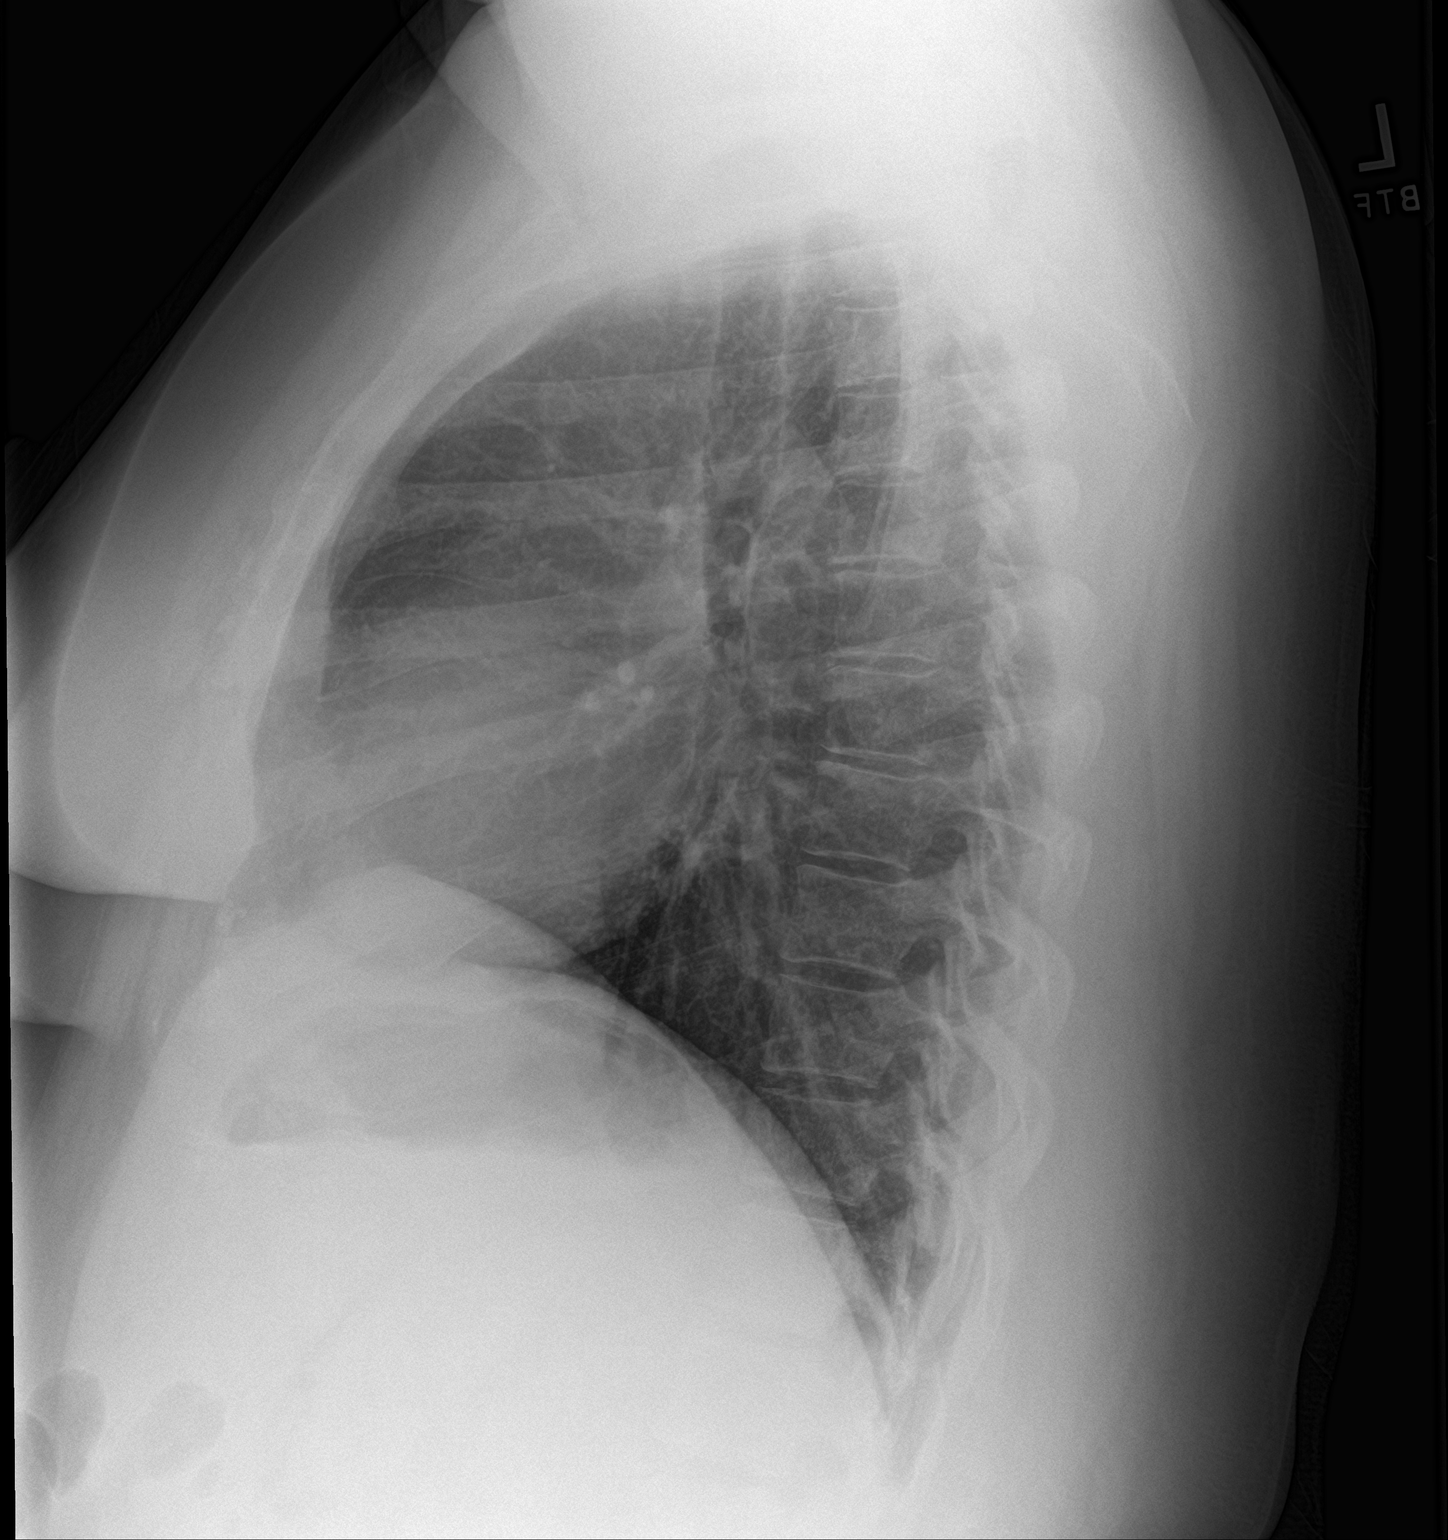

[2 of 2 positions shown; findings below may reference images not displayed]

FINDINGS: Lung volumes are normal. No consolidative airspace disease. No
pleural effusions. No pneumothorax. No pulmonary nodule or mass
noted. Pulmonary vasculature and the cardiomediastinal silhouette
are within normal limits.
IMPRESSION: No radiographic evidence of acute cardiopulmonary disease.

## 2021-12-21 ENCOUNTER — Encounter: Payer: 59 | Attending: Surgery | Admitting: Skilled Nursing Facility1

## 2021-12-21 DIAGNOSIS — E669 Obesity, unspecified: Secondary | ICD-10-CM

## 2021-12-21 DIAGNOSIS — Z713 Dietary counseling and surveillance: Secondary | ICD-10-CM | POA: Diagnosis not present

## 2021-12-22 ENCOUNTER — Encounter: Payer: Self-pay | Admitting: Skilled Nursing Facility1

## 2021-12-22 NOTE — Progress Notes (Signed)
Follow-up visit:  Post-Operative RYGB Surgery  Medical Nutrition Therapy:  Appt start time: 6:00pm end time:  7:00pm  Primary concerns today: Post-operative Bariatric Surgery Nutrition Management 6 Month Post-Op Class  Surgery date: 07/26/2021 Surgery type: RYGB Start weight at [facility]: 279.5 Weight today: 207.6 pounds   Body Composition Scale 08/11/2021 10/07/2021 12/22/2021  Current Body Weight 257.8 230.5 207.6  Total Body Fat % 44.9 42 39  Visceral Fat 15  11  Fat-Free Mass % 55  60.9   Total Body Water % 42 43.4 44.9  Muscle-Mass lbs 33.3  32.8  BMI 41.5  33.4  Body Fat Displacement            Torso  lbs 71.8  50.2         Left Leg  lbs 14.3  10         Right Leg  lbs 14.3  10         Left Arm  lbs 7.1  5         Right Arm   lbs 7.1  5   Clinical  Medical hx: N/A Medications: see list Labs: A1C 5.6, HDL 37.4, vitamin D 28 Notable signs/symptoms: some back aches  Any previous deficiencies? No    Information Reviewed/ Discussed During Appointment: -Review of composition scale numbers -Fluid requirements (64-100 ounces) -Protein requirements (60-80g) -Strategies for tolerating diet -Advancement of diet to include Starchy vegetables -Barriers to inclusion of new foods -Inclusion of appropriate multivitamin and calcium supplements  -Exercise recommendations   Fluid intake: adequate   Medications: See List Supplementation: appropriate    Using straws: no Drinking while eating: no Having you been chewing well: yes Chewing/swallowing difficulties: no Changes in vision: no Changes to mood/headaches: no Hair loss/Cahnges to skin/Changes to nails: no Any difficulty focusing or concentrating: no Sweating: no Dizziness/Lightheaded: no Palpitations: no  Carbonated beverages: no N/V/D/C/GAS: no Abdominal Pain: no Dumping syndrome: no  Recent physical activity:  ADL's  Progress Towards Goal(s):  In Progress Teaching method utilized: Visual & Auditory   Demonstrated degree of understanding via: Teach Back  Readiness Level: Action Barriers to learning/adherence to lifestyle change: none identified  Handouts given during visit include: Phase V diet Progression  Goals Sheet The Benefits of Exercise are endless..... Support Group Topics   Teaching Method Utilized:  Visual Auditory Hands on  Demonstrated degree of understanding via:  Teach Back   Monitoring/Evaluation:  Dietary intake, exercise, and body weight. Follow up in 3 months for 9 month post-op visit.

## 2022-03-21 ENCOUNTER — Encounter: Payer: Self-pay | Admitting: Skilled Nursing Facility1

## 2022-03-21 ENCOUNTER — Encounter: Payer: 59 | Attending: Surgery | Admitting: Skilled Nursing Facility1

## 2022-03-21 DIAGNOSIS — E669 Obesity, unspecified: Secondary | ICD-10-CM | POA: Diagnosis not present

## 2022-03-21 NOTE — Progress Notes (Signed)
Follow-up visit:  Post-Operative RYGB Surgery   Primary concerns today: Post-operative Bariatric Surgery Nutrition Management   Surgery date: 07/26/2021 Surgery type: RYGB Start weight at [facility]: 279.5 Weight today: 196.3 pounds   Body Composition Scale 08/11/2021 10/07/2021 12/22/2021 03/21/2022  Current Body Weight 257.8 230.5 207.6 196.3  Total Body Fat % 44.9 42 39 37.4  Visceral Fat 15  11 10   Fat-Free Mass % 55  60.9 62.5   Total Body Water % 42 43.4 44.9 45.7  Muscle-Mass lbs 33.3  32.8 32.6  BMI 41.5  33.4 31.5  Body Fat Displacement             Torso  lbs 71.8  50.2 45.4         Left Leg  lbs 14.3  10 9.0         Right Leg  lbs 14.3  10 9.0         Left Arm  lbs 7.1  5 4.5         Right Arm   lbs 7.1  5 4.5   Clinical  Medical hx: N/A Medications: see list; no longer taking losartan, no longer taking extra vitamin D Labs:  Notable signs/symptoms: some back aches  Any previous deficiencies? No  Pt arrives doing very well and feeling great with a new found love of running!  Pt states she had a headache for the last couple days stating she is thinking it was allergies. Pt states she thinks grapes and brussles causes painful gas so she avoids them  24 hr recall: First meal: oatmeal Snack: protein shake Second meal: chic fila nuggets and fruit Snack: nuts or peanut butter Third Meal: chicken + salad + sour cream + salsa Snack:    Beverages: half and half sweet tea, water  Fluid intake: 60 oz  Medications: See List Supplementation: multi and calcium    Using straws: no Drinking while eating: no Having you been chewing well: yes Chewing/swallowing difficulties: no Changes in vision: no Changes to mood/headaches: no Hair loss/Cahnges to skin/Changes to nails: no Any difficulty focusing or concentrating: no Sweating: no Dizziness/Lightheaded: no Palpitations: no  Carbonated beverages: no N/V/D/C/GAS: no Abdominal Pain: no Dumping syndrome: no  Recent  physical activity:  2-3 miles on the treadmill running 5 days a week   Progress Towards Goal(s):  In Progress Teaching method utilized: Visual & Auditory  Demonstrated degree of understanding via: Teach Back  Readiness Level: Action Barriers to learning/adherence to lifestyle change: none identified  Goals: Yoga 2-3 days a week  Teaching Method Utilized:  Visual Auditory Hands on  Demonstrated degree of understanding via:  Teach Back   Monitoring/Evaluation:  Dietary intake, exercise, and body weight. Follow up in February

## 2022-07-21 ENCOUNTER — Ambulatory Visit: Payer: 59 | Admitting: Skilled Nursing Facility1

## 2023-02-27 ENCOUNTER — Encounter (HOSPITAL_COMMUNITY): Payer: Self-pay | Admitting: *Deleted
